# Patient Record
Sex: Male | Born: 1985 | Race: Black or African American | Hispanic: No | Marital: Married | Smoking: Never smoker
Health system: Southern US, Community
[De-identification: ages and names within clinical notes are randomized; demographics above are authoritative.]

---

## 2007-10-27 ENCOUNTER — Encounter: Admission: RE | Admit: 2007-10-27 | Discharge: 2007-10-27 | Payer: Self-pay | Admitting: Occupational Medicine

## 2009-08-02 IMAGING — CR DG CHEST 1V
1 series · 1 of 1 positions shown · non-contrast
Comparison: None

CLINICAL DATA: Physical clearance

CHEST - 1 VIEW

[view not recorded]
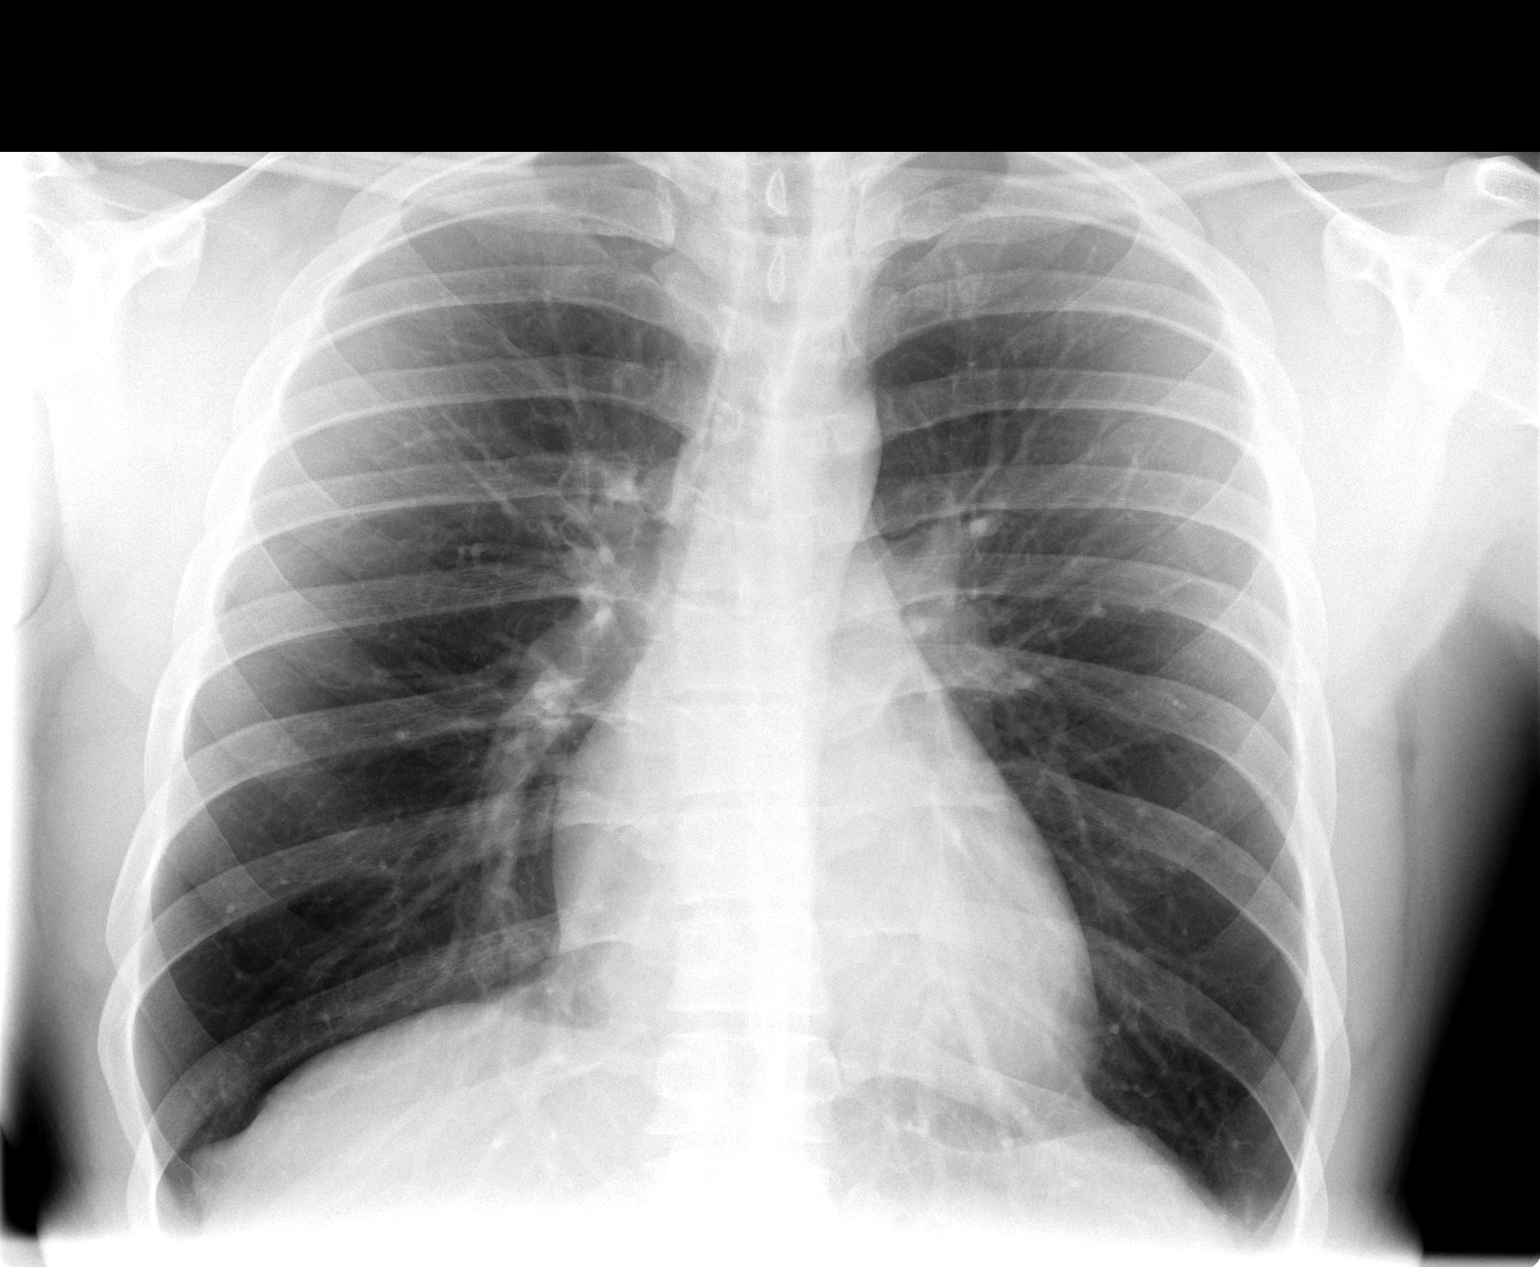

[1 of 1 positions shown; findings below may reference images not displayed]

FINDINGS: Heart size and vascularity are normal and the lungs are
clear.  No bony abnormality of significance.  There is a mild
thoracic scoliosis.
IMPRESSION: No acute disease in the chest.

## 2014-06-14 ENCOUNTER — Encounter (HOSPITAL_COMMUNITY): Payer: Self-pay | Admitting: Family Medicine

## 2014-06-14 ENCOUNTER — Emergency Department (HOSPITAL_COMMUNITY)
Admission: EM | Admit: 2014-06-14 | Discharge: 2014-06-14 | Disposition: A | Discharge status: Home / Self Care | Payer: Worker's Compensation | Attending: Emergency Medicine | Admitting: Emergency Medicine

## 2014-06-14 DIAGNOSIS — M545 Low back pain, unspecified: Secondary | ICD-10-CM

## 2014-06-14 DIAGNOSIS — Y998 Other external cause status: Secondary | ICD-10-CM | POA: Insufficient documentation

## 2014-06-14 DIAGNOSIS — W000XXA Fall on same level due to ice and snow, initial encounter: Secondary | ICD-10-CM | POA: Insufficient documentation

## 2014-06-14 DIAGNOSIS — Y92480 Sidewalk as the place of occurrence of the external cause: Secondary | ICD-10-CM | POA: Insufficient documentation

## 2014-06-14 DIAGNOSIS — M62830 Muscle spasm of back: Secondary | ICD-10-CM

## 2014-06-14 DIAGNOSIS — S3992XA Unspecified injury of lower back, initial encounter: Secondary | ICD-10-CM | POA: Insufficient documentation

## 2014-06-14 DIAGNOSIS — Y9301 Activity, walking, marching and hiking: Secondary | ICD-10-CM | POA: Insufficient documentation

## 2014-06-14 DIAGNOSIS — W009XXA Unspecified fall due to ice and snow, initial encounter: Secondary | ICD-10-CM

## 2014-06-14 MED ORDER — METHOCARBAMOL 500 MG PO TABS: 500.0000 mg | ORAL_TABLET | Freq: Two times a day (BID) | ORAL | Status: DC

## 2014-06-14 NOTE — ED Provider Notes (Signed)
CSN: [id number]     Arrival date & time 06/14/14  2028 History  This chart was scribed for non-physician practitioner, Antony Madura, PA-C working with Toy Cookey, MD by Freida Busman, ED Scribe. This patient was seen in room WTR6/WTR6 and the patient's care was started at 8:42 PM.   Chief Complaint  Patient presents with  . Fall    The history is provided by the patient. No language interpreter was used.    HPI Comments:  Joshua Perkins is a 29 y.o. male who presents to the Emergency Department s/p fall this evening complaining of mild throbbing, nonradiating right lower back pain following the incident . Pt slipped on ice ~1745 while walking on the sidewalk and fell onto his back. He denies head injury and LOC. He also denies numbness/weakness to his BLE, and bowel/bladder incontinence. He denies h/o chronic back pain. No aggravating or alleviating factors noted.   History reviewed. No pertinent past medical history. History reviewed. No pertinent past surgical history. History reviewed. No pertinent family history. History  Substance Use Topics  . Smoking status: Never Smoker   . Smokeless tobacco: Not on file  . Alcohol Use: Yes    Review of Systems  Musculoskeletal: Positive for back pain. Negative for neck pain.  Neurological: Negative for weakness, numbness and headaches.  All other systems reviewed and are negative.   Allergies  Review of patient's allergies indicates no known allergies.  Home Medications   Prior to Admission medications   Medication Sig Start Date End Date Taking? Authorizing Provider  methocarbamol (ROBAXIN) 500 MG tablet Take 1 tablet (500 mg total) by mouth 2 (two) times daily. 06/14/14   Antony Madura, PA-C   BP 124/86 mmHg  Pulse 71  Temp(Src) 98.7 F (37.1 C) (Oral)  Resp 20  Ht 6' (1.829 m)  Wt 240 lb (108.863 kg)  BMI 32.54 kg/m2  SpO2 97%   Physical Exam  Constitutional: He is oriented to person, place, and time. He appears  well-developed and well-nourished. No distress.  Nontoxic/nonseptic appearing  HENT:  Head: Normocephalic and atraumatic.  Eyes: Conjunctivae and EOM are normal. No scleral icterus.  Neck: Normal range of motion.  Cardiovascular: Normal rate, regular rhythm and intact distal pulses.   DP and PT pulses 2+ b/l  Pulmonary/Chest: Effort normal. No respiratory distress.  Respirations even and unlabored.  Musculoskeletal: Normal range of motion.       Thoracic back: Normal.       Lumbar back: He exhibits tenderness and spasm. He exhibits normal range of motion, no bony tenderness, no swelling and no deformity.       Back:  TTP to R lumbar paraspinal muscles with appreciable spasm. No TTP to the T/L midline. No bony deformities, step offs, or crepitus.  Neurological: He is alert and oriented to person, place, and time. He exhibits normal muscle tone. Coordination normal.  Sensation to light touch intact. Patellar and Achilles reflexes 2+ bilaterally. Patient ambulatory with steady gait.  Skin: Skin is warm and dry. No rash noted. He is not diaphoretic. No erythema. No pallor.  Psychiatric: He has a normal mood and affect. His behavior is normal.  Nursing note and vitals reviewed.   ED Course  Procedures   DIAGNOSTIC STUDIES:  Oxygen Saturation is 97% on RA, normal by my interpretation.    COORDINATION OF CARE:  8:47 PM Advised pt to apply ice and heat at home and to take anti-inflammatories. Will discharge with muscle relaxer. Discussed treatment plan  with pt at bedside and pt agreed to plan.  Labs Review Labs Reviewed - No data to display  Imaging Review No results found.   EKG Interpretation None      MDM   Final diagnoses:  Fall due to ice or snow, initial encounter  Spasm of muscle, back  Right-sided low back pain without sciatica    Patient with back pain after a slip and fall on ice. No head trauma or loss of consciousness. Patient neurovascularly intact. No  tenderness to the thoracic or lumbar midline. There is right lumbar paraspinal muscle tenderness with appreciable spasm. No loss of bowel or bladder control. No concern for cauda equina. No indication for further emergent workup or imaging. RICE protocol and pain medicine indicated and discussed with patient. Return precautions discussed and provided. Patient agreeable to plan with no unaddressed concerns.  I personally performed the services described in this documentation, which was scribed in my presence. The recorded information has been reviewed and is accurate.   Filed Vitals:   06/14/14 2038  BP: 124/86  Pulse: 71  Temp: 98.7 F (37.1 C)  TempSrc: Oral  Resp: 20  Height: 6' (1.829 m)  Weight: 240 lb (108.863 kg)  SpO2: 97%      Antony MaduraKelly Milind Raether, PA-C 06/14/14 2104  Toy CookeyMegan Docherty, MD 06/15/14 [phone]

## 2014-06-14 NOTE — ED Notes (Signed)
Patient states he was walking on side walk. Fell from the pavement being icy and slippery. Pt landed on back. Pt didn't take any medications before coming to facility. Denies tingling or numbness and loss of bowel/bladder.

## 2014-06-14 NOTE — Discharge Instructions (Signed)
Alternate ice and heat 3-4 times per day. Take 600mg  ibuprofen every 6 hours or Naproxen (Aleve) twice a day. Take this with Robaxin for muscle spasms. Follow up with your doctor for a recheck of symptoms in 1 week.  Muscle Cramps and Spasms Muscle cramps and spasms occur when a muscle or muscles tighten and you have no control over this tightening (involuntary muscle contraction). They are a common problem and can develop in any muscle. The most common place is in the calf muscles of the leg. Both muscle cramps and muscle spasms are involuntary muscle contractions, but they also have differences:   Muscle cramps are sporadic and painful. They may last a few seconds to a quarter of an hour. Muscle cramps are often more forceful and last longer than muscle spasms.  Muscle spasms may or may not be painful. They may also last just a few seconds or much longer. CAUSES  It is uncommon for cramps or spasms to be due to a serious underlying problem. In many cases, the cause of cramps or spasms is unknown. Some common causes are:   Overexertion.   Overuse from repetitive motions (doing the same thing over and over).   Remaining in a certain position for a long period of time.   Improper preparation, form, or technique while performing a sport or activity.   Dehydration.   Injury.   Side effects of some medicines.   Abnormally low levels of the salts and ions in your blood (electrolytes), especially potassium and calcium. This could happen if you are taking water pills (diuretics) or you are pregnant.  Some underlying medical problems can make it more likely to develop cramps or spasms. These include, but are not limited to:   Diabetes.   Parkinson disease.   Hormone disorders, such as thyroid problems.   Alcohol abuse.   Diseases specific to muscles, joints, and bones.   Blood vessel disease where not enough blood is getting to the muscles.  HOME CARE INSTRUCTIONS    Stay well hydrated. Drink enough water and fluids to keep your urine clear or pale yellow.  It may be helpful to massage, stretch, and relax the affected muscle.  For tight or tense muscles, use a warm towel, heating pad, or hot shower water directed to the affected area.  If you are sore or have pain after a cramp or spasm, applying ice to the affected area may relieve discomfort.  Put ice in a plastic bag.  Place a towel between your skin and the bag.  Leave the ice on for 15-20 minutes, 03-04 times a day.  Medicines used to treat a known cause of cramps or spasms may help reduce their frequency or severity. Only take over-the-counter or prescription medicines as directed by your caregiver. SEEK MEDICAL CARE IF:  Your cramps or spasms get more severe, more frequent, or do not improve over time.  MAKE SURE YOU:   Understand these instructions.  Will watch your condition.  Will get help right away if you are not doing well or get worse. Document Released: 10/06/2001 Document Revised: 08/11/2012 Document Reviewed: 04/02/2012 [facility] Patient Information 2015 [place], [place]. This information is not intended to replace advice given to you by your health care provider. Make sure you discuss any questions you have with your health care provider. Back Pain, Adult Low back pain is very common. About 1 in 5 people have back pain.The cause of low back pain is rarely dangerous. The pain often gets  better over time.About half of people with a sudden onset of back pain feel better in just 2 weeks. About 8 in 10 people feel better by 6 weeks.  CAUSES Some common causes of back pain include:  Strain of the muscles or ligaments supporting the spine.  Wear and tear (degeneration) of the spinal discs.  Arthritis.  Direct injury to the back. DIAGNOSIS Most of the time, the direct cause of low back pain is not known.However, back pain can be treated effectively even when the exact cause  of the pain is unknown.Answering your caregiver's questions about your overall health and symptoms is one of the most accurate ways to make sure the cause of your pain is not dangerous. If your caregiver needs more information, he or she may order lab work or imaging tests (X-rays or MRIs).However, even if imaging tests show changes in your back, this usually does not require surgery. HOME CARE INSTRUCTIONS For many people, back pain returns.Since low back pain is rarely dangerous, it is often a condition that people can learn to [facility] their own.   Remain active. It is stressful on the back to sit or stand in one place. Do not sit, drive, or stand in one place for more than 30 minutes at a time. Take short walks on level surfaces as soon as pain allows.Try to increase the length of time you walk each day.  Do not stay in bed.Resting more than 1 or 2 days can delay your recovery.  Do not avoid exercise or work.Your body is made to move.It is not dangerous to be active, even though your back may hurt.Your back will likely heal faster if you return to being active before your pain is gone.  Pay attention to your body when you bend and lift. Many people have less discomfortwhen lifting if they bend their knees, keep the load close to their bodies,and avoid twisting. Often, the most comfortable positions are those that put less stress on your recovering back.  Find a comfortable position to sleep. Use a firm mattress and lie on your side with your knees slightly bent. If you lie on your back, put a pillow under your knees.  Only take over-the-counter or prescription medicines as directed by your caregiver. Over-the-counter medicines to reduce pain and inflammation are often the most helpful.Your caregiver may prescribe muscle relaxant drugs.These medicines help dull your pain so you can more quickly return to your normal activities and healthy exercise.  Put ice on the injured  area.  Put ice in a plastic bag.  Place a towel between your skin and the bag.  Leave the ice on for 15-20 minutes, 03-04 times a day for the first 2 to 3 days. After that, ice and heat may be alternated to reduce pain and spasms.  Ask your caregiver about trying back exercises and gentle massage. This may be of some benefit.  Avoid feeling anxious or stressed.Stress increases muscle tension and can worsen back pain.It is important to recognize when you are anxious or stressed and learn ways to manage it.Exercise is a great option. SEEK MEDICAL CARE IF:  You have pain that is not relieved with rest or medicine.  You have pain that does not improve in 1 week.  You have new symptoms.  You are generally not feeling well. SEEK IMMEDIATE MEDICAL CARE IF:   You have pain that radiates from your back into your legs.  You develop new bowel or bladder control problems.  You  have unusual weakness or numbness in your arms or legs.  You develop nausea or vomiting.  You develop abdominal pain.  You feel faint. Document Released: 04/16/2005 Document Revised: 10/16/2011 Document Reviewed: 08/18/2013 [facility] Patient Information 2015 [place], [place]. This information is not intended to replace advice given to you by your health care provider. Make sure you discuss any questions you have with your health care provider.

## 2016-10-28 DIAGNOSIS — L739 Follicular disorder, unspecified: Secondary | ICD-10-CM | POA: Diagnosis not present

## 2019-06-19 ENCOUNTER — Ambulatory Visit: Payer: Self-pay | Attending: Internal Medicine

## 2019-06-19 DIAGNOSIS — Z23 Encounter for immunization: Secondary | ICD-10-CM | POA: Insufficient documentation

## 2019-06-19 NOTE — Progress Notes (Signed)
   Covid-19 Vaccination Clinic  Name:  Joshua Perkins    MRN: [id number] DOB: 07/29/1985  06/19/2019  [name] was observed post Covid-19 immunization for 15 minutes without incidence. He was provided with Vaccine Information Sheet and instruction to access the V-Safe system.   [name] was instructed to call 911 with any severe reactions post vaccine: Marland Kitchen Difficulty breathing  . Swelling of your face and throat  . A fast heartbeat  . A bad rash all over your body  . Dizziness and weakness    Immunizations Administered    Name Date Dose VIS Date Route   Pfizer COVID-19 Vaccine 06/19/2019 11:10 AM 0.3 mL 04/10/2019 Intramuscular   Manufacturer: [employer], [employer]   Lot: [id number]   NDC: [address]-4

## 2019-07-14 ENCOUNTER — Ambulatory Visit: Payer: Self-pay | Attending: Internal Medicine

## 2019-07-14 DIAGNOSIS — Z23 Encounter for immunization: Secondary | ICD-10-CM

## 2019-07-14 NOTE — Progress Notes (Signed)
   Covid-19 Vaccination Clinic  Name:  Joshua Perkins    MRN: [id number] DOB: 1985/07/28  07/14/2019  [name] was observed post Covid-19 immunization for 15 minutes without incident. He was provided with Vaccine Information Sheet and instruction to access the V-Safe system.   [name] was instructed to call 911 with any severe reactions post vaccine: Marland Kitchen Difficulty breathing  . Swelling of face and throat  . A fast heartbeat  . A bad rash all over body  . Dizziness and weakness   Immunizations Administered    Name Date Dose VIS Date Route   Pfizer COVID-19 Vaccine 07/14/2019 12:26 PM 0.3 mL 04/10/2019 Intramuscular   Manufacturer: [employer], [employer]   Lot: [id number]   NDC: [address]-1

## 2019-09-16 ENCOUNTER — Other Ambulatory Visit: Payer: Self-pay

## 2019-09-17 ENCOUNTER — Ambulatory Visit: Payer: Self-pay | Admitting: Internal Medicine

## 2019-09-17 DIAGNOSIS — Z0289 Encounter for other administrative examinations: Secondary | ICD-10-CM

## 2019-10-20 ENCOUNTER — Ambulatory Visit: Payer: Self-pay | Admitting: Registered Nurse

## 2019-10-21 ENCOUNTER — Other Ambulatory Visit: Payer: Self-pay

## 2019-10-21 ENCOUNTER — Encounter: Payer: Self-pay | Admitting: Registered Nurse

## 2019-10-21 ENCOUNTER — Ambulatory Visit: Payer: 59 | Admitting: Registered Nurse

## 2019-10-21 VITALS — BP 115/77 | HR 80 | Temp 98.4°F | Ht 72.0 in | Wt 278.0 lb

## 2019-10-21 DIAGNOSIS — Z1329 Encounter for screening for other suspected endocrine disorder: Secondary | ICD-10-CM | POA: Diagnosis not present

## 2019-10-21 DIAGNOSIS — Z23 Encounter for immunization: Secondary | ICD-10-CM

## 2019-10-21 DIAGNOSIS — Z1159 Encounter for screening for other viral diseases: Secondary | ICD-10-CM

## 2019-10-21 DIAGNOSIS — Z13228 Encounter for screening for other metabolic disorders: Secondary | ICD-10-CM

## 2019-10-21 DIAGNOSIS — Z7689 Persons encountering health services in other specified circumstances: Secondary | ICD-10-CM

## 2019-10-21 DIAGNOSIS — Z13220 Encounter for screening for lipoid disorders: Secondary | ICD-10-CM

## 2019-10-21 DIAGNOSIS — Z130 Encounter for screening for diseases of the blood and blood-forming organs and certain disorders involving the immune mechanism: Secondary | ICD-10-CM

## 2019-10-21 DIAGNOSIS — R0683 Snoring: Secondary | ICD-10-CM

## 2019-10-21 NOTE — Patient Instructions (Signed)
° ° ° °  If you have lab work done today you will be contacted with your lab results within the next 2 weeks.  If you have not heard from us then please contact us. The fastest way to get your results is to register for My Chart. ° ° °IF you received an x-ray today, you will receive an invoice from [facility]. Please contact [facility] at [phone] with questions or concerns regarding your invoice.  ° °IF you received labwork today, you will receive an invoice from [facility]. Please contact [facility] at [phone] with questions or concerns regarding your invoice.  ° °Our billing staff will not be able to assist you with questions regarding bills from these companies. ° °You will be contacted with the lab results as soon as they are available. The fastest way to get your results is to activate your My Chart account. Instructions are located on the last page of this paperwork. If you have not heard from us regarding the results in 2 weeks, please contact this office. °  ° ° ° °

## 2019-10-22 LAB — HEMOGLOBIN A1C
Est. average glucose Bld gHb Est-mCnc: 114 mg/dL
Hgb A1c MFr Bld: 5.6 % (ref 4.8–5.6)

## 2019-10-22 LAB — COMPREHENSIVE METABOLIC PANEL
ALT: 43 IU/L (ref 0–44)
AST: 28 IU/L (ref 0–40)
Albumin/Globulin Ratio: 1.6 (ref 1.2–2.2)
Albumin: 4.4 g/dL (ref 4.0–5.0)
Alkaline Phosphatase: 50 IU/L (ref 48–121)
BUN/Creatinine Ratio: 8 — ABNORMAL LOW (ref 9–20)
BUN: 10 mg/dL (ref 6–20)
Bilirubin Total: 0.2 mg/dL (ref 0.0–1.2)
CO2: 22 mmol/L (ref 20–29)
Calcium: 9.3 mg/dL (ref 8.7–10.2)
Chloride: 104 mmol/L (ref 96–106)
Creatinine, Ser: 1.18 mg/dL (ref 0.76–1.27)
GFR calc Af Amer: 93 mL/min/{1.73_m2} (ref >59)
GFR calc non Af Amer: 80 mL/min/{1.73_m2} (ref >59)
Globulin, Total: 2.7 g/dL (ref 1.5–4.5)
Glucose: 94 mg/dL (ref 65–99)
Potassium: 4.3 mmol/L (ref 3.5–5.2)
Sodium: 143 mmol/L (ref 134–144)
Total Protein: 7.1 g/dL (ref 6.0–8.5)

## 2019-10-22 LAB — HIV ANTIBODY (ROUTINE TESTING W REFLEX): HIV Screen 4th Generation wRfx: NONREACTIVE

## 2019-10-22 LAB — CBC WITH DIFFERENTIAL
Basophils Absolute: 0 10*3/uL (ref 0.0–0.2)
Basos: 0 %
EOS (ABSOLUTE): 0.1 10*3/uL (ref 0.0–0.4)
Eos: 2 %
Hematocrit: 42.4 % (ref 37.5–51.0)
Hemoglobin: 14.6 g/dL (ref 13.0–17.7)
Immature Grans (Abs): 0 10*3/uL (ref 0.0–0.1)
Immature Granulocytes: 0 %
Lymphocytes Absolute: 2.3 10*3/uL (ref 0.7–3.1)
Lymphs: 29 %
MCH: 29.9 pg (ref 26.6–33.0)
MCHC: 34.4 g/dL (ref 31.5–35.7)
MCV: 87 fL (ref 79–97)
Monocytes Absolute: 1 10*3/uL — ABNORMAL HIGH (ref 0.1–0.9)
Monocytes: 13 %
Neutrophils Absolute: 4.4 10*3/uL (ref 1.4–7.0)
Neutrophils: 56 %
RBC: 4.89 x10E6/uL (ref 4.14–5.80)
RDW: 13 % (ref 11.6–15.4)
WBC: 7.9 10*3/uL (ref 3.4–10.8)

## 2019-10-22 LAB — LIPID PANEL
Chol/HDL Ratio: 5.5 ratio — ABNORMAL HIGH (ref 0.0–5.0)
Cholesterol, Total: 238 mg/dL — ABNORMAL HIGH (ref 100–199)
HDL: 43 mg/dL (ref >39)
LDL Chol Calc (NIH): 175 mg/dL — ABNORMAL HIGH (ref 0–99)
Triglycerides: 110 mg/dL (ref 0–149)
VLDL Cholesterol Cal: 20 mg/dL (ref 5–40)

## 2019-10-22 LAB — TSH: TSH: 1.61 u[IU]/mL (ref 0.450–4.500)

## 2019-10-22 LAB — HEPATITIS C ANTIBODY: Hep C Virus Ab: <0.1 s/co ratio (ref 0.0–0.9)

## 2019-10-22 LAB — SPECIMEN STATUS REPORT

## 2019-12-02 ENCOUNTER — Telehealth: Payer: Self-pay | Admitting: Registered Nurse

## 2019-12-02 NOTE — Telephone Encounter (Signed)
Please advise on this bc I do not see that a referral has been placed.

## 2019-12-02 NOTE — Telephone Encounter (Signed)
Pt is stating on his last dos he talked about getting a referral for his sleep apnea. I do not see a referral placed, please advise at [phone]

## 2020-01-01 ENCOUNTER — Ambulatory Visit: Payer: Self-pay | Admitting: Internal Medicine

## 2020-01-05 ENCOUNTER — Other Ambulatory Visit: Payer: Self-pay | Admitting: Registered Nurse

## 2020-01-05 DIAGNOSIS — R0681 Apnea, not elsewhere classified: Secondary | ICD-10-CM

## 2020-01-21 ENCOUNTER — Encounter: Payer: Self-pay | Admitting: Registered Nurse

## 2020-01-21 NOTE — Progress Notes (Signed)
New Patient Office Visit  Subjective:  Patient ID: Joshua Perkins, male    DOB: [birth year]  Age: 34 y.o. MRN: [id number]  CC:  Chief Complaint  Patient presents with   Establish Care   snoring while sleeping    loudly     HPI Joshua Perkins presents for visit to est care  Histories reviewed  Patients concern today is snoring. Partner has witnessed apnea, gasping, and coughing in sleep. Pt reports waking with headache, daytime fatigue, family hx of osa  Denies chest pain, shob, doe, visual changes, dependent edema  No other concerns at this time  No past medical history on file.  No past surgical history on file.  No family history on file.  Social History   Socioeconomic History   Marital status: Unknown    Spouse name: Not on file   Number of children: Not on file   Years of education: Not on file   Highest education level: Not on file  Occupational History   Not on file  Tobacco Use   Smoking status: Never Smoker   Smokeless tobacco: Never Used  Substance and Sexual Activity   Alcohol use: Yes   Drug use: No   Sexual activity: Yes  Other Topics Concern   Not on file  Social History Narrative   Not on file   Social Determinants of Health   Financial Resource Strain:    Difficulty of Paying Living Expenses: Not on file  Food Insecurity:    Worried About Running Out of Food in the Last Year: Not on file   Ran Out of Food in the Last Year: Not on file  Transportation Needs:    Lack of Transportation (Medical): Not on file   Lack of Transportation (Non-Medical): Not on file  Physical Activity:    Days of Exercise per Week: Not on file   Minutes of Exercise per Session: Not on file  Stress:    Feeling of Stress : Not on file  Social Connections:    Frequency of Communication with Friends and Family: Not on file   Frequency of Social Gatherings with Friends and Family: Not on file   Attends Religious Services: Not on file    Active Member of Clubs or Organizations: Not on file   Attends Banker Meetings: Not on file   Marital Status: Not on file  Intimate Partner Violence:    Fear of Current or Ex-Partner: Not on file   Emotionally Abused: Not on file   Physically Abused: Not on file   Sexually Abused: Not on file    ROS Review of Systems Per hpi   Objective:   Today's Vitals: BP 115/77    Pulse 80    Temp 98.4 F (36.9 C) (Temporal)    Ht 6' (1.829 m)    Wt 278 lb (126.1 kg)    SpO2 99%    BMI 37.70 kg/m   Physical Exam Vitals and nursing note reviewed.  Constitutional:      General: He is not in acute distress.    Appearance: Normal appearance. He is obese. He is not ill-appearing, toxic-appearing or diaphoretic.  Cardiovascular:     Rate and Rhythm: Normal rate and regular rhythm.     Heart sounds: Normal heart sounds. No murmur heard.  No friction rub. No gallop.   Pulmonary:     Effort: Pulmonary effort is normal. No respiratory distress.     Breath sounds: Normal breath sounds. No stridor. No  wheezing, rhonchi or rales.  Chest:     Chest wall: No tenderness.  Skin:    General: Skin is warm and dry.  Neurological:     General: No focal deficit present.     Mental Status: He is alert and oriented to person, place, and time. Mental status is at baseline.  Psychiatric:        Mood and Affect: Mood normal.        Thought Content: Thought content normal.        Judgment: Judgment normal.     Assessment & Plan:   Problem List Items Addressed This Visit    None    Visit Diagnoses    Screening for endocrine, metabolic and immunity disorder    -  Primary   Relevant Orders   CBC With Differential (Completed)   Comprehensive metabolic panel (Completed)   TSH (Completed)   Hemoglobin A1c (Completed)   HIV antibody (with reflex)   Need for diphtheria-tetanus-pertussis (Tdap) vaccine       Relevant Orders   Tdap vaccine greater than or equal to 7yo IM   Screening  for viral disease       Relevant Orders   Hepatitis C antibody (Completed)   Lipid screening       Relevant Orders   Lipid panel (Completed)      Outpatient Encounter Medications as of 10/21/2019  Medication Sig   [DISCONTINUED] methocarbamol (ROBAXIN) 500 MG tablet Take 1 tablet (500 mg total) by mouth 2 (two) times daily.   No facility-administered encounter medications on file as of 10/21/2019.    Follow-up: No follow-ups on file.   PLAN  Suspect OSA. Will refer to pulm  Routine labs collected, will follow up as warranted  Patient encouraged to call clinic with any questions, comments, or concerns.  Janeece Agee, NP

## 2020-02-01 ENCOUNTER — Institutional Professional Consult (permissible substitution): Payer: 59 | Admitting: Pulmonary Disease

## 2020-02-12 ENCOUNTER — Telehealth: Payer: Self-pay | Admitting: Registered Nurse

## 2020-02-12 NOTE — Telephone Encounter (Signed)
Referral Followup °

## 2020-03-03 ENCOUNTER — Encounter: Payer: Self-pay | Admitting: Internal Medicine

## 2020-03-03 ENCOUNTER — Ambulatory Visit (INDEPENDENT_AMBULATORY_CARE_PROVIDER_SITE_OTHER): Payer: 59 | Admitting: Internal Medicine

## 2020-03-03 ENCOUNTER — Other Ambulatory Visit: Payer: Self-pay

## 2020-03-03 VITALS — BP 122/68 | HR 97 | Temp 97.5°F | Ht 72.0 in | Wt 263.8 lb

## 2020-03-03 DIAGNOSIS — R0683 Snoring: Secondary | ICD-10-CM | POA: Diagnosis not present

## 2020-03-03 DIAGNOSIS — G4720 Circadian rhythm sleep disorder, unspecified type: Secondary | ICD-10-CM

## 2020-03-03 DIAGNOSIS — Z23 Encounter for immunization: Secondary | ICD-10-CM | POA: Diagnosis not present

## 2020-03-03 DIAGNOSIS — G4733 Obstructive sleep apnea (adult) (pediatric): Secondary | ICD-10-CM | POA: Diagnosis not present

## 2020-03-03 NOTE — Patient Instructions (Signed)
Order- schedule home sleep test dx OSA  Please call us about 2 weeks after your sleep test to see if results and recommendations are ready. If appropriate, we may be able to start treatment before we see you next.

## 2020-03-03 NOTE — Progress Notes (Signed)
03/03/20- 34 yo M never smoker for sleep evaluation courtesy of Janeece Agee, NP with concern of witnessed apnea. Works Doctor, general practice, night shift,  Medical problem list includes- none reported Epworth score- 8 Body weight today- 263 lbs Covid vax- 2 Phizer Flu vax- standard -----pt has nver had sleep consult and pt states snoring and stop breathing in sleep Wife complains of his snoring and witnessed apnea.  He tries to maintain appropriate daytime sleep environment- discussed. Occasionally tired at work.  No sleep meds. Occasional energy drink. Parents were loud snorers. Denies ENT surgery, heart, lung or neurologic problems. No parasomnias.   Prior to Admission medications   Not on File   No past medical history on file. No past surgical history on file. No family history on file. Social History   Socioeconomic History  . Marital status: Unknown    Spouse name: Not on file  . Number of children: Not on file  . Years of education: Not on file  . Highest education level: Not on file  Occupational History  . Not on file  Tobacco Use  . Smoking status: Never Smoker  . Smokeless tobacco: Never Used  Substance and Sexual Activity  . Alcohol use: Yes  . Drug use: No  . Sexual activity: Yes  Other Topics Concern  . Not on file  Social History Narrative  . Not on file   Social Determinants of Health   Financial Resource Strain:   . Difficulty of Paying Living Expenses: Not on file  Food Insecurity:   . Worried About Programme researcher, broadcasting/film/video in the Last Year: Not on file  . Ran Out of Food in the Last Year: Not on file  Transportation Needs:   . Lack of Transportation (Medical): Not on file  . Lack of Transportation (Non-Medical): Not on file  Physical Activity:   . Days of Exercise per Week: Not on file  . Minutes of Exercise per Session: Not on file  Stress:   . Feeling of Stress : Not on file  Social Connections:   . Frequency of Communication with Friends  and Family: Not on file  . Frequency of Social Gatherings with Friends and Family: Not on file  . Attends Religious Services: Not on file  . Active Member of Clubs or Organizations: Not on file  . Attends Banker Meetings: Not on file  . Marital Status: Not on file  Intimate Partner Violence:   . Fear of Current or Ex-Partner: Not on file  . Emotionally Abused: Not on file  . Physically Abused: Not on file  . Sexually Abused: Not on file   ROS-see HPI  + = positive Constitutional:    weight loss, night sweats, fevers, chills, fatigue, lassitude. HEENT:    headaches, difficulty swallowing, tooth/dental problems, sore throat,       sneezing, itching, ear ache, nasal congestion, post nasal drip, snoring CV:    chest pain, orthopnea, PND, swelling in lower extremities, anasarca,                                  dizziness, palpitations Resp:   shortness of breath with exertion or at rest.                productive cough,   non-productive cough, coughing up of blood.              change in color of  mucus.  wheezing.   Skin:    rash or lesions. GI:  No-   heartburn, indigestion, abdominal pain, nausea, vomiting, diarrhea,                 change in bowel habits, loss of appetite GU: dysuria, change in color of urine, no urgency or frequency.   flank pain. MS:   joint pain, stiffness, decreased range of motion, back pain. Neuro-     nothing unusual Psych:  change in mood or affect.  depression or anxiety.   memory loss.  OBJ- Physical Exam General- Alert, Oriented, Affect-appropriate, Distress- none acute, +overweight/ muscular Skin- rash-none, lesions- none, excoriation- none Lymphadenopathy- none Head- atraumatic            Eyes- Gross vision intact, PERRLA, conjunctivae and secretions clear            Ears- Hearing, canals-normal            Nose- Clear, no-Septal dev, mucus, polyps, erosion, perforation             Throat- Mallampati II -III, mucosa clear , drainage- none,  tonsils- atrophic, + teeth Neck- flexible , trachea midline, no stridor , thyroid nl, carotid no bruit Chest - symmetrical excursion , unlabored           Heart/CV- RRR , no murmur , no gallop  , no rub, nl s1 s2                           - JVD- none , edema- none, stasis changes- none, varices- none           Lung- clear to P&A, wheeze- none, cough- none , dullness-none, rub- none           Chest wall-  Abd-  Br/ Gen/ Rectal- Not done, not indicated Extrem- cyanosis- none, clubbing, none, atrophy- none, strength- nl Neuro- grossly intact to observation

## 2020-03-18 ENCOUNTER — Encounter: Payer: Self-pay | Admitting: Registered Nurse

## 2020-03-18 ENCOUNTER — Other Ambulatory Visit: Payer: Self-pay

## 2020-03-18 ENCOUNTER — Ambulatory Visit: Payer: 59 | Admitting: Registered Nurse

## 2020-03-18 VITALS — BP 126/78 | HR 75 | Temp 98.0°F | Resp 18 | Ht 72.0 in | Wt 262.0 lb

## 2020-03-18 DIAGNOSIS — E782 Mixed hyperlipidemia: Secondary | ICD-10-CM

## 2020-03-18 NOTE — Progress Notes (Signed)
Established Patient Office Visit  Subjective:  Patient ID: Joshua Perkins, male    DOB: [birth year]  Age: 34 y.o. MRN: [id number]  CC:  Chief Complaint  Patient presents with  . Follow-up    Patient states he is here to follow up on his labs he had done in june. And also to check lipids.    HPI Joshua Perkins presents for lab review  Noted in June 2021 he had elevated lipids: Lipid Panel     Component Value Date/Time   CHOL 238 (H) 10/21/2019 1638   TRIG 110 10/21/2019 1638   HDL 43 10/21/2019 1638   CHOLHDL 5.5 (H) 10/21/2019 1638   LDLCALC 175 (H) 10/21/2019 1638   LABVLDL 20 10/21/2019 1638   Patient has some questions and concerns regarding these results and what he can do to best address these.  In addition, patient has recently been seen by Sleep med to get scheduled for home sleep study for potential OSA. Epworth score noted to be at 8.  No past medical history on file.  No past surgical history on file.  No family history on file.  Social History   Socioeconomic History  . Marital status: Unknown    Spouse name: Not on file  . Number of children: Not on file  . Years of education: Not on file  . Highest education level: Not on file  Occupational History  . Not on file  Tobacco Use  . Smoking status: Never Smoker  . Smokeless tobacco: Never Used  Substance and Sexual Activity  . Alcohol use: Yes  . Drug use: No  . Sexual activity: Yes  Other Topics Concern  . Not on file  Social History Narrative  . Not on file   Social Determinants of Health   Financial Resource Strain:   . Difficulty of Paying Living Expenses: Not on file  Food Insecurity:   . Worried About Programme researcher, broadcasting/film/video in the Last Year: Not on file  . Ran Out of Food in the Last Year: Not on file  Transportation Needs:   . Lack of Transportation (Medical): Not on file  . Lack of Transportation (Non-Medical): Not on file  Physical Activity:   . Days of Exercise per Week: Not on file    . Minutes of Exercise per Session: Not on file  Stress:   . Feeling of Stress : Not on file  Social Connections:   . Frequency of Communication with Friends and Family: Not on file  . Frequency of Social Gatherings with Friends and Family: Not on file  . Attends Religious Services: Not on file  . Active Member of Clubs or Organizations: Not on file  . Attends Banker Meetings: Not on file  . Marital Status: Not on file  Intimate Partner Violence:   . Fear of Current or Ex-Partner: Not on file  . Emotionally Abused: Not on file  . Physically Abused: Not on file  . Sexually Abused: Not on file    No outpatient medications prior to visit.   No facility-administered medications prior to visit.    No Known Allergies  ROS Review of Systems  Constitutional: Negative.   HENT: Negative.   Eyes: Negative.   Respiratory: Negative.   Cardiovascular: Negative.   Gastrointestinal: Negative.   Genitourinary: Negative.   Musculoskeletal: Negative.   Skin: Negative.   Neurological: Negative.   Psychiatric/Behavioral: Negative.       Objective:    Physical Exam Constitutional:  General: He is not in acute distress.    Appearance: Normal appearance. He is normal weight. He is not ill-appearing, toxic-appearing or diaphoretic.  Cardiovascular:     Rate and Rhythm: Normal rate and regular rhythm.     Heart sounds: Normal heart sounds. No murmur heard.  No friction rub. No gallop.   Pulmonary:     Effort: Pulmonary effort is normal. No respiratory distress.     Breath sounds: Normal breath sounds. No stridor. No wheezing, rhonchi or rales.  Chest:     Chest wall: No tenderness.  Neurological:     General: No focal deficit present.     Mental Status: He is alert and oriented to person, place, and time. Mental status is at baseline.  Psychiatric:        Mood and Affect: Mood normal.        Behavior: Behavior normal.        Thought Content: Thought content  normal.        Judgment: Judgment normal.     BP (!) 143/83   Pulse 75   Temp 98 F (36.7 C) (Temporal)   Resp 18   Ht 6' (1.829 m)   Wt 262 lb (118.8 kg)   SpO2 96%   BMI 35.53 kg/m  Wt Readings from Last 3 Encounters:  03/18/20 262 lb (118.8 kg)  03/03/20 263 lb 12.8 oz (119.7 kg)  10/21/19 278 lb (126.1 kg)     There are no preventive care reminders to display for this patient.  There are no preventive care reminders to display for this patient.  Lab Results  Component Value Date   TSH 1.610 10/21/2019   Lab Results  Component Value Date   WBC 7.9 10/21/2019   HGB 14.6 10/21/2019   HCT 42.4 10/21/2019   MCV 87 10/21/2019   Lab Results  Component Value Date   NA 143 10/21/2019   K 4.3 10/21/2019   CO2 22 10/21/2019   GLUCOSE 94 10/21/2019   BUN 10 10/21/2019   CREATININE 1.18 10/21/2019   BILITOT 0.2 10/21/2019   ALKPHOS 50 10/21/2019   AST 28 10/21/2019   ALT 43 10/21/2019   PROT 7.1 10/21/2019   ALBUMIN 4.4 10/21/2019   CALCIUM 9.3 10/21/2019   Lab Results  Component Value Date   CHOL 238 (H) 10/21/2019   Lab Results  Component Value Date   HDL 43 10/21/2019   Lab Results  Component Value Date   LDLCALC 175 (H) 10/21/2019   Lab Results  Component Value Date   TRIG 110 10/21/2019   Lab Results  Component Value Date   CHOLHDL 5.5 (H) 10/21/2019   Lab Results  Component Value Date   HGBA1C 5.6 10/21/2019      Assessment & Plan:   Problem List Items Addressed This Visit    None    Visit Diagnoses    Mixed hyperlipidemia    -  Primary   Relevant Orders   Lipid Panel      No orders of the defined types were placed in this encounter.   Follow-up: No follow-ups on file.   PLAN  Recheck lipids. May start statin therapy if warranted  Had extensive discussion of lifestyle modifications for control of hyperlipidemia  Return to clinic in 6 mo for CPE and labs.  Patient encouraged to call clinic with any questions,  comments, or concerns.  Janeece Agee, NP

## 2020-03-18 NOTE — Patient Instructions (Addendum)
If you have lab work done today you will be contacted with your lab results within the next 2 weeks.  If you have not heard from [place] then please contact us. The fastest way to get your results is to register for My Chart.   IF you received an x-ray today, you will receive an invoice from [facility]. Please contact [facility] at [phone] with questions or concerns regarding your invoice.   IF you received labwork today, you will receive an invoice from [facility]. Please contact [facility] at [phone] with questions or concerns regarding your invoice.   Our billing staff will not be able to assist you with questions regarding bills from these companies.  You will be contacted with the lab results as soon as they are available. The fastest way to get your results is to activate your My Chart account. Instructions are located on the last page of this paperwork. If you have not heard from [place] regarding the results in 2 weeks, please contact this office.      Dyslipidemia Dyslipidemia is an imbalance of waxy, fat-like substances (lipids) in the blood. The body needs lipids in small amounts. Dyslipidemia often involves a high level of cholesterol or triglycerides, which are types of lipids. Common forms of dyslipidemia include:  High levels of LDL cholesterol. LDL is the type of cholesterol that causes fatty deposits (plaques) to build up in the blood vessels that carry blood away from your heart (arteries).  Low levels of HDL cholesterol. HDL cholesterol is the type of cholesterol that protects against heart disease. High levels of HDL remove the LDL buildup from arteries.  High levels of triglycerides. Triglycerides are a fatty substance in the blood that is linked to a buildup of plaques in the arteries. What are the causes? Primary dyslipidemia is caused by changes (mutations) in genes that are passed down through families (inherited). These mutations cause several  types of dyslipidemia. Secondary dyslipidemia is caused by lifestyle choices and diseases that lead to dyslipidemia, such as:  Eating a diet that is high in animal fat.  Not getting enough exercise.  Having diabetes, kidney disease, liver disease, or thyroid disease.  Drinking large amounts of alcohol.  Using certain medicines. What increases the risk? You are more likely to develop this condition if you are an older man or if you are a woman who has gone through menopause. Other risk factors include:  Having a family history of dyslipidemia.  Taking certain medicines, including birth control pills, steroids, some diuretics, and beta-blockers.  Smoking cigarettes.  Eating a high-fat diet.  Having certain medical conditions such as diabetes, polycystic ovary syndrome (PCOS), kidney disease, liver disease, or hypothyroidism.  Not exercising regularly.  Being overweight or obese with too much belly fat. What are the signs or symptoms? In most cases, dyslipidemia does not usually cause any symptoms. In severe cases, very high lipid levels can cause:  Fatty bumps under the skin (xanthomas).  White or gray ring around the black center (pupil) of the eye. Very high triglyceride levels can cause inflammation of the pancreas (pancreatitis). How is this diagnosed? Your health care provider may diagnose dyslipidemia based on a routine blood test (fasting blood test). Because most people do not have symptoms of the condition, this blood testing (lipid profile) is done on adults age 72 and older and is repeated every 5 years. This test checks:  Total cholesterol. This measures the total amount of cholesterol in your blood, including  LDL cholesterol, HDL cholesterol, and triglycerides. A healthy number is below 200.  LDL cholesterol. The target number for LDL cholesterol is different for each person, depending on individual risk factors. Ask your health care provider what your LDL  cholesterol should be.  HDL cholesterol. An HDL level of 60 or higher is best because it helps to protect against heart disease. A number below 40 for men or below 107 for women increases the risk for heart disease.  Triglycerides. A healthy triglyceride number is below 150. If your lipid profile is abnormal, your health care provider may do other blood tests. How is this treated? Treatment depends on the type of dyslipidemia that you have and your other risk factors for heart disease and stroke. Your health care provider will have a target range for your lipid levels based on this information. For many people, this condition may be treated by lifestyle changes, such as diet and exercise. Your health care provider may recommend that you:  Get regular exercise.  Make changes to your diet.  Quit smoking if you smoke. If diet changes and exercise do not help you reach your goals, your health care provider may also prescribe medicine to lower lipids. The most commonly prescribed type of medicine lowers your LDL cholesterol (statin drug). If you have a high triglyceride level, your provider may prescribe another type of drug (fibrate) or an omega-3 fish oil supplement, or both. Follow these instructions at home:  Eating and drinking  Follow instructions from your health care provider or dietitian about eating or drinking restrictions.  Eat a healthy diet as told by your health care provider. This can help you reach and maintain a healthy weight, lower your LDL cholesterol, and raise your HDL cholesterol. This may include: ? Limiting your calories, if you are overweight. ? Eating more fruits, vegetables, whole grains, fish, and lean meats. ? Limiting saturated fat, trans fat, and cholesterol.  If you drink alcohol: ? Limit how much you use. ? Be aware of how much alcohol is in your drink. In the [place] one drink equals one 12 oz bottle of beer (355 mL), one 5 oz glass of wine (148 mL), or one 1  oz glass of hard liquor (44 mL).  Do not drink alcohol if: ? Your health care provider tells you not to drink. ? You are pregnant, may be pregnant, or are planning to become pregnant. Activity  Get regular exercise. Start an exercise and strength training program as told by your health care provider. Ask your health care provider what activities are safe for you. Your health care provider may recommend: ? 30 minutes of aerobic activity 4-6 days a week. Brisk walking is an example of aerobic activity. ? Strength training 2 days a week. General instructions  Do not use any products that contain nicotine or tobacco, such as cigarettes, e-cigarettes, and chewing tobacco. If you need help quitting, ask your health care provider.  Take over-the-counter and prescription medicines only as told by your health care provider. This includes supplements.  Keep all follow-up visits as told by your health care provider. Contact a health care provider if:  You are: ? Having trouble sticking to your exercise or diet plan. ? Struggling to quit smoking or control your use of alcohol. Summary  Dyslipidemia often involves a high level of cholesterol or triglycerides, which are types of lipids.  Treatment depends on the type of dyslipidemia that you have and your other risk factors for heart disease  and stroke.  For many people, treatment starts with lifestyle changes, such as diet and exercise.  Your health care provider may prescribe medicine to lower lipids. This information is not intended to replace advice given to you by your health care provider. Make sure you discuss any questions you have with your health care provider. Document Revised: 12/09/2017 Document Reviewed: 11/15/2017 Elsevier Patient Education  2020 [employer]  Preventing High Cholesterol Cholesterol is a white, waxy substance similar to fat that the human body needs to help build cells. The liver makes all the cholesterol that  a person's body needs. Having high cholesterol (hypercholesterolemia) increases a person's risk for heart disease and stroke. Extra (excess) cholesterol comes from the food the person eats. High cholesterol can often be prevented with diet and lifestyle changes. If you already have high cholesterol, you can control it with diet and lifestyle changes and with medicine. How can high cholesterol affect me? If you have high cholesterol, deposits (plaques) may build up on the walls of your arteries. The arteries are the blood vessels that carry blood away from your heart. Plaques make the arteries narrower and stiffer. This can limit or block blood flow and cause blood clots to form. Blood clots:  Are tiny balls of cells that form in your blood.  Can move to the heart or brain, causing a heart attack or stroke. Plaques in arteries greatly increase your risk for heart attack and stroke.Making diet and lifestyle changes can reduce your risk for these conditions that may threaten your life. What can increase my risk? This condition is more likely to develop in people who:  Eat foods that are high in saturated fat or cholesterol. Saturated fat is mostly found in: ? Foods that contain animal fat, such as red meat and some dairy products. ? Certain fatty foods made from plants, such as tropical oils.  Are overweight.  Are not getting enough exercise.  Have a family history of high cholesterol. What actions can I take to prevent this? Nutrition   Eat less saturated fat.  Avoid trans fats (partially hydrogenated oils). These are often found in margarine and in some baked goods, fried foods, and snacks bought in packages.  Avoid precooked or cured meat, such as sausages or meat loaves.  Avoid foods and drinks that have added sugars.  Eat more fruits, vegetables, and whole grains.  Choose healthy sources of protein, such as fish, poultry, lean cuts of red meat, beans, peas, lentils, and  nuts.  Choose healthy sources of fat, such as: ? Nuts. ? Vegetable oils, especially olive oil. ? Fish that have healthy fats (omega-3 fatty acids), such as mackerel or salmon. The items listed above may not be a complete list of recommended foods and beverages. Contact a dietitian for more information. Lifestyle  Lose weight if you are overweight. Losing 5-10 lb (2.3-4.5 kg) can help prevent or control high cholesterol. It can also lower your risk for diabetes and high blood pressure. Ask your health care provider to help you with a diet and exercise plan to lose weight safely.  Do not use any products that contain nicotine or tobacco, such as cigarettes, e-cigarettes, and chewing tobacco. If you need help quitting, ask your health care provider.  Limit your alcohol intake. ? Do not drink alcohol if:  Your health care provider tells you not to drink.  You are pregnant, may be pregnant, or are planning to become pregnant. ? If you drink alcohol:  Limit how  much you use to:  0-1 drink a day for women.  0-2 drinks a day for men.  Be aware of how much alcohol is in your drink. In the [place] one drink equals one 12 oz bottle of beer (355 mL), one 5 oz glass of wine (148 mL), or one 1 oz glass of hard liquor (44 mL). Activity   Get enough exercise. Each week, do at least 150 minutes of exercise that takes a medium level of effort (moderate-intensity exercise). ? This is exercise that:  Makes your heart beat faster and makes you breathe harder than usual.  Allows you to still be able to talk. ? You could exercise in short sessions several times a day or longer sessions a few times a week. For example, on 5 days each week, you could walk fast or ride your bike 3 times a day for 10 minutes each time.  Do exercises as told by your health care provider. Medicines  In addition to diet and lifestyle changes, your health care provider may recommend medicines to help lower cholesterol.  This may be a medicine to lower the amount of cholesterol your liver makes. You may need medicine if: ? Diet and lifestyle changes do not lower your cholesterol enough. ? You have high cholesterol and other risk factors for heart disease or stroke.  Take over-the-counter and prescription medicines only as told by your health care provider. General information  Manage your risk factors for high cholesterol. Talk with your health care provider about all your risk factors and how to lower your risk.  Manage other conditions that you have, such as diabetes or high blood pressure (hypertension).  Have blood tests to check your cholesterol levels at regular points in time as told by your health care provider.  Keep all follow-up visits as told by your health care provider. This is important. Where to find more information  American Heart Association: www.heart.org  National Heart, Lung, and Blood Institute: [employer].is Summary  High cholesterol increases your risk for heart disease and stroke. By keeping your cholesterol level low, you can reduce your risk for these conditions.  High cholesterol can often be prevented with diet and lifestyle changes.  Work with your health care provider to manage your risk factors, and have your blood tested regularly. This information is not intended to replace advice given to you by your health care provider. Make sure you discuss any questions you have with your health care provider. Document Revised: 08/08/2018 Document Reviewed: 12/24/2015 Elsevier Patient Education  2020 [employer].

## 2020-03-19 ENCOUNTER — Encounter: Payer: Self-pay | Admitting: Internal Medicine

## 2020-03-19 DIAGNOSIS — G4720 Circadian rhythm sleep disorder, unspecified type: Secondary | ICD-10-CM | POA: Insufficient documentation

## 2020-03-19 DIAGNOSIS — R0683 Snoring: Secondary | ICD-10-CM | POA: Insufficient documentation

## 2020-03-19 LAB — LIPID PANEL
Chol/HDL Ratio: 5.3 ratio — ABNORMAL HIGH (ref 0.0–5.0)
Cholesterol, Total: 248 mg/dL — ABNORMAL HIGH (ref 100–199)
HDL: 47 mg/dL (ref >39)
LDL Chol Calc (NIH): 179 mg/dL — ABNORMAL HIGH (ref 0–99)
Triglycerides: 121 mg/dL (ref 0–149)
VLDL Cholesterol Cal: 22 mg/dL (ref 5–40)

## 2020-03-19 NOTE — Assessment & Plan Note (Signed)
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

## 2020-03-19 NOTE — Assessment & Plan Note (Signed)
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  Plan- sleep study, then possibly CPAP

## 2020-04-01 ENCOUNTER — Ambulatory Visit: Payer: 59

## 2020-04-01 ENCOUNTER — Other Ambulatory Visit: Payer: Self-pay

## 2020-04-01 DIAGNOSIS — G4733 Obstructive sleep apnea (adult) (pediatric): Secondary | ICD-10-CM

## 2020-04-06 DIAGNOSIS — G4733 Obstructive sleep apnea (adult) (pediatric): Secondary | ICD-10-CM

## 2020-05-03 ENCOUNTER — Telehealth: Payer: Self-pay | Admitting: Internal Medicine

## 2020-05-03 NOTE — Telephone Encounter (Signed)
VS please advise on home sleep test.  Thanks!

## 2020-05-04 NOTE — Telephone Encounter (Signed)
lmtcb for pt. Will order after speaking to pt. Pt needs to postpone 06/04/19 OV with Dr. Maple Hudson.

## 2020-05-04 NOTE — Telephone Encounter (Signed)
Sleep study showed obstructive sleep apnea averaging about 14 apneas/ hour, with drops in blood oxygen levels.  I do recommend order new DME, new CPAP auto 5-20, mask of choice, humidifier, supplies, AirView/ card  Please make sure he has a return ov with me in 31-90 days, per insurance regs.

## 2020-05-04 NOTE — Telephone Encounter (Signed)
I believe this is a patient of Dr. Maple Hudson.

## 2020-05-05 NOTE — Telephone Encounter (Signed)
Lmtcb for pt.  

## 2020-05-10 ENCOUNTER — Telehealth: Payer: Self-pay | Admitting: Internal Medicine

## 2020-05-10 ENCOUNTER — Encounter: Payer: Self-pay | Admitting: Emergency Medicine

## 2020-05-10 DIAGNOSIS — G4733 Obstructive sleep apnea (adult) (pediatric): Secondary | ICD-10-CM

## 2020-05-10 NOTE — Telephone Encounter (Signed)
CPAP settings ordered in my jan 5 comment in this chain.

## 2020-05-10 NOTE — Telephone Encounter (Signed)
Called and spoke with patient to let him know of sleep study results. Patient expressed understanding and would like to proceed with CPAP set up. Advised patient that there was a recall a few months ago on phillips CPAP machine and that the DME companies are trying to get those patients taken care of so there is a bit of a delay on getting patients CPAP machines. He expressed understanding.   Dr. Maple Hudson please advise on CPAP order for patient   Joshua Budge, MD to [facility] Triage Pool     9:15 AM Note Sleep study showed obstructive sleep apnea averaging about 14 apneas/ hour, with drops in blood oxygen levels.  I do recommend order new DME, new CPAP auto 5-20, mask of choice, humidifier, supplies, AirView/ card  Please make sure he has a return ov with me in 31-90 days, per insurance regs.

## 2020-05-10 NOTE — Telephone Encounter (Signed)
Due to several unsuccessful attempts to reach pt, message will be closed per triage protocol. Letter will be sent to pt to return our call for the results.    Triage, please print and mail letter (letter already created). Encounter can be closed once complete.

## 2020-05-10 NOTE — Telephone Encounter (Signed)
Letter printed and placed in outgoing mail for the pt.

## 2020-05-11 NOTE — Telephone Encounter (Signed)
Order for CPAP has been placed. Nothing further needed at this time.

## 2020-06-02 NOTE — Progress Notes (Deleted)
03/03/20- 35 yo M never smoker for sleep evaluation courtesy of Janeece Agee, NP with concern of witnessed apnea. Works Doctor, general practice, night shift,  Medical problem list includes- none reported Epworth score- 8 Body weight today- 263 lbs Covid vax- 2 Phizer Flu vax- standard -----pt has nver had sleep consult and pt states snoring and stop breathing in sleep Wife complains of his snoring and witnessed apnea.  He tries to maintain appropriate daytime sleep environment- discussed. Occasionally tired at work.  No sleep meds. Occasional energy drink. Parents were loud snorers. Denies ENT surgery, heart, lung or neurologic problems. No parasomnias.   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 HEENT:    headaches, difficulty swallowing, tooth/dental problems, sore throat,       sneezing, itching, ear ache, nasal congestion, post nasal drip, snoring CV:    chest pain, orthopnea, PND, swelling in lower extremities, anasarca,                                   dizziness, palpitations Resp:   shortness of breath with exertion or at rest.                productive cough,   non-productive cough, coughing up of blood.              change in color of mucus.  wheezing.   Skin:    rash or lesions. GI:  No-   heartburn, indigestion, abdominal pain, nausea, vomiting, diarrhea,                 change in bowel habits, loss of appetite GU: dysuria, change in color of urine, no urgency or frequency.   flank pain. MS:   joint pain, stiffness, decreased range of motion, back pain. Neuro-     nothing unusual Psych:  change in mood or affect.  depression or anxiety.   memory  loss.  OBJ- Physical Exam General- Alert, Oriented, Affect-appropriate, Distress- none acute, +overweight/ muscular Skin- rash-none, lesions- none, excoriation- none Lymphadenopathy- none Head- atraumatic            Eyes- Gross vision intact, PERRLA, conjunctivae and secretions clear            Ears- Hearing, canals-normal            Nose- Clear, no-Septal dev, mucus, polyps, erosion, perforation             Throat- Mallampati II -III, mucosa clear , drainage- none, tonsils- atrophic, + teeth Neck- flexible , trachea midline, no stridor , thyroid nl, carotid no bruit Chest - symmetrical excursion , unlabored           Heart/CV- RRR , no murmur , no gallop  , no rub, nl s1 s2                           - JVD- none , edema- none, stasis changes- none, varices- none           Lung- clear to P&A, wheeze- none, cough- none , dullness-none, rub- none  Chest wall-  Abd-  Br/ Gen/ Rectal- Not done, not indicated Extrem- cyanosis- none, clubbing, none, atrophy- none, strength- nl Neuro- grossly intact to observation

## 2020-06-03 ENCOUNTER — Ambulatory Visit: Payer: 59 | Admitting: Internal Medicine

## 2020-11-18 ENCOUNTER — Other Ambulatory Visit: Payer: Self-pay | Admitting: Nurse Practitioner

## 2020-11-18 ENCOUNTER — Ambulatory Visit
Admission: RE | Admit: 2020-11-18 | Discharge: 2020-11-18 | Disposition: A | Discharge status: Home / Self Care | Payer: No Typology Code available for payment source | Source: Ambulatory Visit | Attending: Nurse Practitioner | Admitting: Nurse Practitioner

## 2020-11-18 DIAGNOSIS — Z021 Encounter for pre-employment examination: Secondary | ICD-10-CM

## 2021-11-29 ENCOUNTER — Telehealth (INDEPENDENT_AMBULATORY_CARE_PROVIDER_SITE_OTHER): Payer: 59 | Admitting: Registered Nurse

## 2021-11-29 ENCOUNTER — Encounter: Payer: Self-pay | Admitting: Registered Nurse

## 2021-11-29 ENCOUNTER — Ambulatory Visit: Payer: 59 | Admitting: Registered Nurse

## 2021-11-29 DIAGNOSIS — E782 Mixed hyperlipidemia: Secondary | ICD-10-CM

## 2021-11-29 DIAGNOSIS — R41840 Attention and concentration deficit: Secondary | ICD-10-CM

## 2021-11-29 NOTE — Patient Instructions (Addendum)
Mr. Goettl -   Randie Heinz to speak with you.  Call with any concerns.  Labs can be done: [address] [place] - call in advance [address] [place] - Walk Ins ok  I recommend these providers:  Jarold Motto, PA Jacquiline Doe, MD Edwina Barth, MD Letta Moynahan Early, NP Jiles Prows, DNP Glenetta Hew, MD   My recommendation for assessment for attention deficit disorders is [facility]. While they treat ADD and ADHD, they operate under their primary care licenses, so no referral is required. Their information is as below:  [facility] [phone] [place], [place] [address]  Phone: [phone] Email: [email or web address]  If they are able to make a diagnosis and establish an effective medication routine, I am happy to provide maintenance and refills of that medication.   If you'd prefer a referral with a psychiatry group, please let me know, I'd be happy to place this referral.    Thank you for letting me take part in your care,  Rich   If you have lab work done today you will be contacted with your lab results within the next 2 weeks.  If you have not heard from [place] then please contact us. The fastest way to get your results is to register for My Chart.   IF you received an x-ray today, you will receive an invoice from [facility]. Please contact [facility] at [phone] with questions or concerns regarding your invoice.   IF you received labwork today, you will receive an invoice from [place]. Please contact [facility] at [phone] with questions or concerns regarding your invoice.   Our billing staff will not be able to assist you with questions regarding bills from these companies.  You will be contacted with the lab results as soon as they are available. The fastest way to get your results is to activate your My Chart account. Instructions are located on the last page of this  paperwork. If you have not heard from [place] regarding the results in 2 weeks, please contact this office.

## 2021-11-29 NOTE — Progress Notes (Signed)
Telemedicine Encounter- SOAP NOTE Established Patient  This telephone encounter was conducted with the patient's (or proxy's) verbal consent via audio telecommunications: yes/no: Yes Patient was instructed to have this encounter in a suitably private space; and to only have persons present to whom they give permission to participate. In addition, patient identity was confirmed by use of name plus two identifiers (DOB and address).  I discussed the limitations, risks, security and privacy concerns of performing an evaluation and management service by telephone and the availability of in person appointments. I also discussed with the patient that there may be a patient responsible charge related to this service. The patient expressed understanding and agreed to proceed.  I spent a total of 15 minutes talking with the patient or their proxy.  Patient at home Provider in office  Participants: Joshua Sportsman, NP and Joshua Perkins  Chief Complaint  Patient presents with   Follow-up    Patient states he wants to discuss cholesterol and ADHD medication    Subjective   Joshua Perkins is a 36 y.o. established patient. Telephone visit today for lipid, concentration deficit  HPI Concentration deficit Ongoing for some time. Noticed by his wife, who encouraged him to get worked up for this. Trouble focusing, completing tasks. Some anxiety associated.  HLD Ongoing. Not medicated. 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 No pertinent past medical history.  No current outpatient medications on file.   No current facility-administered  medications for this visit.    No Known Allergies  Social History   Socioeconomic History   Marital status: Unknown    Spouse name: Not on file   Number of children: Not on file   Years of education: Not on file   Highest education level: Not on file  Occupational History   Not on file  Tobacco Use   Smoking status: Never   Smokeless tobacco: Never  Substance and Sexual Activity   Alcohol use: Yes   Drug use: No   Sexual activity: Yes  Other Topics Concern   Not on file  Social History Narrative   Not on file   Social Determinants of Health   Financial Resource Strain: Not on file  Food Insecurity: Not on file  Transportation Needs: Not on file  Physical Activity: Not on file  Stress: Not on file  Social Connections: Not on file  Intimate Partner Violence: Not on file    ROS Per hpi   Objective   Vitals as reported by the patient: There were no vitals filed for this visit.  Joshua Perkins was seen today for follow-up.  Diagnoses and all orders for this visit:  Mixed hyperlipidemia -     Comprehensive metabolic panel -     Lipid panel -     Hemoglobin A1c  Concentration deficit    PLAN Future labs placed. Follow up as warranted Advised to seek work up with [employer]. Info given. Advised to have 2023 CPE. Patient encouraged to call clinic with any questions, comments, or concerns.   I discussed the assessment and treatment plan with the  patient. The patient was provided an opportunity to ask questions and all were answered. The patient agreed with the plan and demonstrated an understanding of the instructions.   The patient was advised to call back or seek an in-person evaluation if the symptoms worsen or if the condition fails to improve as anticipated.  I provided 15 minutes of non-face-to-face time during this encounter.  Joshua Agee, NP

## 2022-01-11 ENCOUNTER — Other Ambulatory Visit (INDEPENDENT_AMBULATORY_CARE_PROVIDER_SITE_OTHER): Payer: 59

## 2022-01-11 DIAGNOSIS — E782 Mixed hyperlipidemia: Secondary | ICD-10-CM | POA: Diagnosis not present

## 2022-01-11 LAB — LIPID PANEL
Cholesterol: 233 mg/dL — ABNORMAL HIGH (ref 0–200)
HDL: 50.4 mg/dL (ref >39.00)
LDL Cholesterol: 162 mg/dL — ABNORMAL HIGH (ref 0–99)
NonHDL: 183
Total CHOL/HDL Ratio: 5
Triglycerides: 105 mg/dL (ref 0.0–149.0)
VLDL: 21 mg/dL (ref 0.0–40.0)

## 2022-01-11 LAB — COMPREHENSIVE METABOLIC PANEL
ALT: 25 U/L (ref 0–53)
AST: 24 U/L (ref 0–37)
Albumin: 4.1 g/dL (ref 3.5–5.2)
Alkaline Phosphatase: 47 U/L (ref 39–117)
BUN: 14 mg/dL (ref 6–23)
CO2: 28 mEq/L (ref 19–32)
Calcium: 8.9 mg/dL (ref 8.4–10.5)
Chloride: 105 mEq/L (ref 96–112)
Creatinine, Ser: 1.44 mg/dL (ref 0.40–1.50)
GFR: 62.57 mL/min (ref >60.00)
Glucose, Bld: 85 mg/dL (ref 70–99)
Potassium: 4.2 mEq/L (ref 3.5–5.1)
Sodium: 140 mEq/L (ref 135–145)
Total Bilirubin: 0.4 mg/dL (ref 0.2–1.2)
Total Protein: 6.9 g/dL (ref 6.0–8.3)

## 2022-01-11 LAB — HEMOGLOBIN A1C: Hgb A1c MFr Bld: 5.7 % (ref 4.6–6.5)

## 2022-01-11 NOTE — Addendum Note (Signed)
Addended by: Eldred Manges on: 01/11/2022 08:07 AM   Modules accepted: Orders

## 2022-02-12 ENCOUNTER — Ambulatory Visit: Payer: 59 | Admitting: Internal Medicine

## 2022-02-12 ENCOUNTER — Encounter: Payer: Self-pay | Admitting: Internal Medicine

## 2022-02-12 VITALS — BP 130/78 | HR 82 | Temp 98.0°F | Resp 12 | Ht 72.0 in | Wt 260.2 lb

## 2022-02-12 DIAGNOSIS — R41840 Attention and concentration deficit: Secondary | ICD-10-CM | POA: Diagnosis not present

## 2022-02-12 DIAGNOSIS — R0683 Snoring: Secondary | ICD-10-CM | POA: Diagnosis not present

## 2022-02-12 DIAGNOSIS — R7303 Prediabetes: Secondary | ICD-10-CM | POA: Diagnosis not present

## 2022-02-12 MED ORDER — BUPROPION HCL ER (XL) 150 MG PO TB24: 150.0000 mg | ORAL_TABLET | Freq: Every day | ORAL | 3 refills | Status: AC

## 2022-02-12 NOTE — Patient Instructions (Signed)
Ketogenic Eating Plan, Adult A ketogenic eating plan is a diet that is very low in carbohydrates, moderately low in protein, and very high in fat. Your body normally gets energy from eating carbohydrates. If you limit carbohydrates in your diet, your body will start to use stored fat as an energy source. When fat is broken down for energy, it enters your blood as a substance called ketones. A ketogenic eating plan relies mostly on ketones for energy. This eating plan can cause rapid weight loss because the body burns fat for fuel. What are the benefits of a ketogenic eating plan? Epilepsy Ketogenic eating plans have been well studied as a treatment for epilepsy in children and have been used for many years. These plans have been studied to treat epilepsy in adults too. You will want to work closely with a dietitian if your health care provider suggests this eating plan to manage your seizures. Other conditions Ketogenic eating plans have also been studied to help treat other conditions, including: Cancer. Type 2 diabetes. Alzheimer's disease. Parkinson's disease. Autism. Multiple sclerosis. More long-term studies are needed to learn the effects of this eating plan on people with these and other conditions. What are the side effects of a ketogenic eating plan? Side effects of a ketogenic diet may include: Vitamin deficiencies. Dehydration. Bad breath. Nausea. Hunger. Constipation. Problems with menstrual periods. Inflammation of the pancreas. Kidney stones or gallbladder stones. Bone fractures. High cholesterol. What are tips for following this plan? Reading food labels Look for foods that have a low glycemic index (GI) label. Read a product's ingredient list to check for hidden or added sugar. Check food labels for the number of grams of carbohydrates and protein in each serving. This is important. Cooking Carefully measure or weigh foods. Make desserts using ketogenic or low GI  recipes. Avoid cooking with sauces that have added sugar, such as barbecue sauce or ketchup. Meal planning There are several versions of ketogenic eating plans. The classic ketogenic diet recommends that 90% of your calories come from fat, 6% from protein, and 4% from carbohydrates. Aim for a daily meal and snack schedule that you can follow steadily. Every meal will include high-fat items, such as avocado, cream, butter, or mayonnaise. Each day, you should have: Fat: ______as much as you want of omega 3, limit saturated fat____g. Protein: ____as much as you want_____g. Carbohydrates: ______under 50___g. General information  Buy a gram scale to weigh foods. This is needed to follow this diet correctly. Your dietitian will teach you how to use it. Ask your health care provider what steps you can take to avoid side effects of this eating plan, such as constipation, dehydration, and kidney stones. Take vitamin and mineral supplements only as told by your health care provider or dietitian. Work with your dietitian and health care provider to help you stay on track. What foods should I eat?  Fruits Fresh blueberries, blackberries, or raspberries. Other fresh and frozen fruits in small amounts. Vegetables Lettuce. Bok choy. Eggplant. Tomatoes. Cucumbers. Peppers. Cauliflower. Zucchini. Fennel. Swiss chard. Grains Almond, hazelnut, or coconut flours. Meats and other proteins Meat, poultry, and fish. Bacon. Eggs. Egg substitutes. Nuts, nut butters (without added sugar), seeds, lentils, and split peas in small amounts. Dairy Cheese in moderate amounts. Beverages Plain or mineral water. Sugar-free, caffeine-free beverages. Club soda. Caffeine-free, carbohydrate-free herbal tea. Fats and oils Avocado. Cream. Sour cream. Cream cheese. Butter. Plant-based oils, such as olive, canola, and sunflower. Margarine. Mayonnaise. Sweets and desserts Any homemade sweets  or desserts made using ketogenic  diet recipes. The items listed above may not be a complete list of recommended foods and beverages. Contact a dietitian for more information. What foods should I avoid? Fruits Fruit juice. Fruits packed in syrups. Dried or candied fruits. Vegetables Corn. Potatoes (all kinds). Peas. Grains All bread, dry cereals, and cooked cereals with added sugar. Baked goods. Crackers and pretzels. Meats and other proteins Meat, poultry, or fish prepared with flour or breading. Nut butters with added sugar. Beans. Dairy Milk. Yogurt. Fats and oils Salad dressings with added sugar. Gravies. Beverages Sugar-sweetened teas, coffee drinks, or soft drinks. Juice. Sports drinks. Sweets and desserts All sweets and desserts, unless the dessert is homemade using ketogenic diet recipes. The items listed above may not be a complete list of foods and beverages to avoid. Contact a dietitian for more information. Summary A ketogenic eating plan is a diet that is very low in carbohydrates, moderately low in protein, and very high in fat. Aim for a daily meal and snack schedule that you can follow steadily. Buy a gram scale to weigh foods. This is needed to follow this diet correctly. Your dietitian will teach you how to use it. Work closely with your health care provider and a dietitian while you are following a ketogenic eating plan. This information is not intended to replace advice given to you by your health care provider. Make sure you discuss any questions you have with your health care provider. Document Revised: 08/27/2019 Document Reviewed: 08/27/2019 Elsevier Patient Education  2023 [employer]. It was a pleasure seeing you today!  Today the plan is...  Snoring Assessment & Plan: Urged him to use the snore lab and if he does not like his results to call me and I will send in a sleep study referral for him to get it repeated and see if he needs to be on CPAP again   Concentration  deficit Assessment & Plan: Discussion where I advised him I believe that he likely has ADHD and not sleep apnea but he will need to confirm by doing snore lab to confirm that the snoring and apnea is well controlled presently with the previous weight loss as well as go to the [facility] to get confirm diagnosis of adult ADHD as I do not do the diagnostic evaluation here.     Prediabetes     Joshua Olszewski, MD   No follow-ups on file.   - If your condition fails to resolve or you have other questions / concerns: please contact me via phone [phone] or MyChart messaging.  - Please bring all your medicines to your next appointment. This is the best way for me to know exactly what you're taking.  - If your condition begins to worsen or become severe:  go to the ER.   IF you received an x-ray today, you will receive an invoice from [facility]. Please contact [facility] at [phone] with questions or concerns regarding your invoice.    IF you received labwork today, you will receive an invoice from [place]. Please contact [facility] at [phone] with questions or concerns regarding your invoice.    Our billing staff will not be able to assist you with questions regarding bills from these companies.   --------------------------------------------------------------------------------------------------------------------  You will be contacted with the lab results as soon as they are available. The fastest way to get your results is to activate your My Chart account. Instructions are located on the  last page of this paperwork. If you have not heard from [place] regarding the results in 2 weeks, please contact this office. For any labs or imaging tests, we will call you if the results are significantly abnormal.  Most normal results will be posted to myChart as soon as they are available and I will comment on them there within 2-3 business days.

## 2022-02-12 NOTE — Progress Notes (Signed)
Today's healthcare provider: Loralee Pacas, MD  Phone: [phone]  New patient visit  Visit Date: 02/12/2022 Patient: Joshua Perkins   DOB: 1986/03/26   36 y.o. Male  MRN: [id number]  Assessment and Plan:     Joshua Perkins was seen today for transfer of care.  Concentration deficit Assessment & Plan: Discussion where I advised him I believe that he likely has ADHD and not sleep apnea but he will need to confirm by doing snore lab to confirm that the snoring and apnea is well controlled presently with the previous weight loss as well as go to the [place] [facility] to get confirm diagnosis of adult ADHD as I do not do the diagnostic evaluation here.    Orders: -     buPROPion HCl ER (XL); Take 1 tablet (150 mg total) by mouth daily.  Dispense: 90 tablet; Refill: 3 -     Neuropsychological assessment; Future  Snoring Overview: Used to also have sleep apnea and he went on an exercise program got healthier and this has almost completely gone away although he still occasionally snores  Assessment & Plan: Joshua Perkins him to use the snore lab and if he does not like his results to call me and I will send in a sleep study referral for him to get it repeated and see if he needs to be on CPAP again   Prediabetes Overview: Lab Results  Component Value Date   HGBA1C 5.7 01/11/2022   HGBA1C 5.6 10/21/2019       Health Maintenance  Topic Date Due   INFLUENZA VACCINE  11/28/2021   TETANUS/TDAP  11/30/2022 (Originally 08/26/2004)   Hepatitis C Screening  Completed   HIV Screening  Completed   HPV VACCINES  Aged Out   COVID-19 Vaccine  Discontinued    Recommended follow up:  No follow-ups on file.   Subjective:  Patient presents today to establish care.  Prior patient of Orland Mustard.  Chief Complaint  Patient presents with   Transfer of care    Wants assessment for ADHD.   For history taking, I took a per problem history from the patient and chart review as follows: Problem   Prediabetes   Lab Results  Component Value Date   HGBA1C 5.7 01/11/2022   HGBA1C 5.6 10/21/2019     Concentration Deficit  Snoring   Used to also have sleep apnea and he went on an exercise program got healthier and this has almost completely gone away although he still occasionally snores      Depression Screen    02/12/2022    3:32 PM 11/29/2021    1:32 PM 03/18/2020    8:37 AM 10/21/2019    3:40 PM  PHQ 2/9 Scores  PHQ - 2 Score 0 0 0 0  PHQ- 9 Score  0     No results found for any visits on 02/12/22.   The following were reviewed and entered/updated in epic: History reviewed. No pertinent past medical history. History reviewed. No pertinent surgical history. History reviewed. No pertinent surgical history. No family status information on file.   History reviewed. No pertinent family history. No outpatient medications prior to visit.   No facility-administered medications prior to visit.    No Known Allergies Social History   Tobacco Use   Smoking status: Never   Smokeless tobacco: Never  Substance Use Topics   Alcohol use: Yes   Drug use: No    Immunization History  Administered Date(s) Administered   Influenza,inj,Quad PF,6+  Mos 03/03/2020   PFIZER(Purple Top)SARS-COV-2 Vaccination 06/19/2019, 07/14/2019   PPD Test 03/14/2016      Objective:  BP 130/78 (BP Location: Right Arm, Patient Position: Sitting)   Pulse 82   Temp 98 F (36.7 C) (Temporal)   Resp 12   Ht 6' (1.829 m)   Wt 260 lb 3.2 oz (118 kg)   SpO2 97%   BMI 35.29 kg/m  Body mass index is 35.29 kg/m.  He  is a very cordial and polite person who was a pleasure to meet. Quite gentlemanly and muscular. About 18" neck circ. Gen: NAD, resting comfortably, HEENT: Mucous membranes are moist. Sclera conjunctiva and lids grossly normal Neck: no thyromegaly, no cervical lymphadenopathy CV: RRR no murmurs rubs or gallops Lungs: CTAB no crackles, wheeze, rhonchi Skin: warm, dry Neuro:  grossly intact  No images are attached to the encounter or orders placed in the encounter.  Results for orders placed or performed in visit on 01/11/22  Comprehensive metabolic panel  Result Value Ref Range   Sodium 140 135 - 145 mEq/L   Potassium 4.2 3.5 - 5.1 mEq/L   Chloride 105 96 - 112 mEq/L   CO2 28 19 - 32 mEq/L   Glucose, Bld 85 70 - 99 mg/dL   BUN 14 6 - 23 mg/dL   Creatinine, Ser 3.77 0.40 - 1.50 mg/dL   Total Bilirubin 0.4 0.2 - 1.2 mg/dL   Alkaline Phosphatase 47 39 - 117 U/L   AST 24 0 - 37 U/L   ALT 25 0 - 53 U/L   Total Protein 6.9 6.0 - 8.3 g/dL   Albumin 4.1 3.5 - 5.2 g/dL   GFR 93.96 >88.64 mL/min   Calcium 8.9 8.4 - 10.5 mg/dL  Lipid panel  Result Value Ref Range   Cholesterol 233 (H) 0 - 200 mg/dL   Triglycerides 847.2 0.0 - 149.0 mg/dL   HDL 07.21 >82.88 mg/dL   VLDL 33.7 0.0 - 44.5 mg/dL   LDL Cholesterol 146 (H) 0 - 99 mg/dL   Total CHOL/HDL Ratio 5    NonHDL 183.00   Hemoglobin A1c  Result Value Ref Range   Hgb A1c MFr Bld 5.7 4.6 - 6.5 %

## 2022-02-12 NOTE — Assessment & Plan Note (Signed)
Urged him to use the snore lab and if he does not like his results to call me and I will send in a sleep study referral for him to get it repeated and see if he needs to be on CPAP again

## 2022-02-12 NOTE — Assessment & Plan Note (Signed)
Discussion where I advised him I believe that he likely has ADHD and not sleep apnea but he will need to confirm by doing snore lab to confirm that the snoring and apnea is well controlled presently with the previous weight loss as well as go to the [facility] to get confirm diagnosis of adult ADHD as I do not do the diagnostic evaluation here.

## 2022-04-05 ENCOUNTER — Encounter: Payer: Self-pay | Admitting: Adult Health

## 2022-04-05 ENCOUNTER — Ambulatory Visit (INDEPENDENT_AMBULATORY_CARE_PROVIDER_SITE_OTHER): Payer: 59 | Admitting: Adult Health

## 2022-04-05 VITALS — BP 110/82 | HR 72 | Ht 72.0 in | Wt 251.6 lb

## 2022-04-05 DIAGNOSIS — G4733 Obstructive sleep apnea (adult) (pediatric): Secondary | ICD-10-CM

## 2022-04-05 NOTE — Patient Instructions (Addendum)
Referral for oral appliance for sleep apnea.  Dr. Irene Limbo [phone]) Healthy weight loss.  Do not drive if sleepy  Follow up in 6 months with Dr. Maple Hudson or Arlynn Mcdermid NP and As needed

## 2022-04-05 NOTE — Assessment & Plan Note (Signed)
Mild OSA - reviewed in detail . Wants to try oral appliance .  - discussed how weight can impact sleep and risk for sleep disordered breathing - discussed options to assist with weight loss: combination of diet modification, cardiovascular and strength training exercises   - had an extensive discussion regarding the adverse health consequences related to untreated sleep disordered breathing - specifically discussed the risks for hypertension, coronary artery disease, cardiac dysrhythmias, cerebrovascular disease, and diabetes - lifestyle modification discussed   - discussed how sleep disruption can increase risk of accidents, particularly when driving - safe driving practices were discussed     Plan  Patient Instructions  Referral for oral appliance for sleep apnea.  Dr. Irene Limbo [phone]) Healthy weight loss.  Do not drive if sleepy  Follow up in 6 months with Dr. Maple Hudson or Abdulrahman Bracey NP and As needed

## 2022-04-05 NOTE — Progress Notes (Signed)
@Patient  ID: , male    DOB: October 02, 1985, 36 y.o.   MRN: [id number]  Chief Complaint  Patient presents with   Follow-up    Referring provider: [id number], MD  HPI: 36 year old male seen for sleep consult March 03, 2020 for snoring, daytime sleepiness and witnessed apneic events found to have sleep apnea  TEST/EVENTS :  04/02/2020 Sleep study showed obstructive sleep apnea averaging about 14 apneas/ hour, with drops in blood oxygen levels.   04/05/2022 Follow up : OSA  Patient returns for a follow-up visit.  Patient was seen last in November 2021 for snoring, daytime sleepiness and witnessed apneic events.  Patient was set up for home sleep study that was completed April 02, 2020 that showed mild obstructive sleep apnea with a AHI of 14/hour.  Patient was recommended begin CPAP.  Patient stated he started to workout and lose weight . Daytime sleepiness is better but continues to snore loudly . Disruptive for spouse . We discussed treatment options. He would like to try oral appliance .   Exercising a lot now , doing cross fit.    No Known Allergies  Immunization History  Administered Date(s) Administered   Influenza,inj,Quad PF,6+ Mos 03/03/2020   PFIZER(Purple Top)SARS-COV-2 Vaccination 06/19/2019, 07/14/2019   PPD Test 03/14/2016    No past medical history on file.  Tobacco History: Social History   Tobacco Use  Smoking Status Never  Smokeless Tobacco Never   Counseling given: Not Answered   Outpatient Medications Prior to Visit  Medication Sig Dispense Refill   buPROPion (WELLBUTRIN XL) 150 MG 24 hr tablet Take 1 tablet (150 mg total) by mouth daily. 90 tablet 3   No facility-administered medications prior to visit.     Review of Systems:   Constitutional:   No  weight loss, night sweats,  Fevers, chills, fatigue, or  lassitude.  HEENT:   No headaches,  Difficulty swallowing,  Tooth/dental problems, or  Sore throat,                No  sneezing, itching, ear ache, nasal congestion, post nasal drip,   CV:  No chest pain,  Orthopnea, PND, swelling in lower extremities, anasarca, dizziness, palpitations, syncope.   GI  No heartburn, indigestion, abdominal pain, nausea, vomiting, diarrhea, change in bowel habits, loss of appetite, bloody stools.   Resp: No shortness of breath with exertion or at rest.  No excess mucus, no productive cough,  No non-productive cough,  No coughing up of blood.  No change in color of mucus.  No wheezing.  No chest wall deformity  Skin: no rash or lesions.  GU: no dysuria, change in color of urine, no urgency or frequency.  No flank pain, no hematuria   MS:  No joint pain or swelling.  No decreased range of motion.  No back pain.    Physical Exam  BP 110/82 (BP Location: Left Arm, Cuff Size: Large)   Pulse 72   Ht 6' (1.829 m)   Wt 251 lb 9.6 oz (114.1 kg)   SpO2 98%   BMI 34.12 kg/m   GEN: A/Ox3; pleasant , NAD, well nourished    HEENT:  [place]/AT, , NOSE-clear, THROAT-clear, no lesions, no postnasal drip or exudate noted. Class 2-3 MP airway , elongated uvula   NECK:  Supple w/ fair ROM; no JVD; normal carotid impulses w/o bruits; no thyromegaly or nodules palpated; no lymphadenopathy.    RESP  Clear  P & A; w/o, wheezes/  rales/ or rhonchi. no accessory muscle use, no dullness to percussion  CARD:  RRR, no m/r/g, no peripheral edema, pulses intact, no cyanosis or clubbing.  GI:   Soft & nt; nml bowel sounds; no organomegaly or masses detected.   Musco: Warm bil, no deformities or joint swelling noted.   Neuro: alert, no focal deficits noted.    Skin: Warm, no lesions or rashes    Lab Results:  CBC   BNP No results found for: "BNP"  ProBNP No results found for: "PROBNP"  Imaging: No results found.        No data to display          No results found for: "NITRICOXIDE"      Assessment & Plan:   OSA (obstructive sleep apnea) Mild OSA - reviewed in  detail . Wants to try oral appliance .  - discussed how weight can impact sleep and risk for sleep disordered breathing - discussed options to assist with weight loss: combination of diet modification, cardiovascular and strength training exercises   - had an extensive discussion regarding the adverse health consequences related to untreated sleep disordered breathing - specifically discussed the risks for hypertension, coronary artery disease, cardiac dysrhythmias, cerebrovascular disease, and diabetes - lifestyle modification discussed   - discussed how sleep disruption can increase risk of accidents, particularly when driving - safe driving practices were discussed     Plan  Patient Instructions  Referral for oral appliance for sleep apnea.  Dr. Irene Limbo [phone]) Healthy weight loss.  Do not drive if sleepy  Follow up in 6 months with Dr. Maple Hudson or Mileydi Milsap NP and As needed        Rubye Oaks, NP 04/05/2022

## 2022-04-12 ENCOUNTER — Encounter: Payer: Self-pay | Admitting: *Deleted

## 2022-05-29 ENCOUNTER — Ambulatory Visit: Payer: 59 | Admitting: Adult Health

## 2022-06-08 ENCOUNTER — Ambulatory Visit (INDEPENDENT_AMBULATORY_CARE_PROVIDER_SITE_OTHER): Payer: 59 | Admitting: Adult Health

## 2022-06-08 ENCOUNTER — Encounter: Payer: Self-pay | Admitting: Adult Health

## 2022-06-08 VITALS — BP 118/80 | HR 96 | Temp 97.6°F | Ht 72.0 in | Wt 252.0 lb

## 2022-06-08 DIAGNOSIS — G4733 Obstructive sleep apnea (adult) (pediatric): Secondary | ICD-10-CM

## 2022-06-08 NOTE — Progress Notes (Signed)
$@PatientY$  ID: Joshua Perkins, male    DOB: 12/08/1985, 37 y.o.   MRN: [id number]  Chief Complaint  Patient presents with   Follow-up    Referring provider: Loralee Pacas, MD  HPI: 37 year old male seen for sleep consult March 03, 2020 for snoring, daytime sleepiness and witnessed apneic events found to have sleep apnea  TEST/EVENTS :  04/02/2020 Sleep study showed obstructive sleep apnea averaging about 14 apneas/ hour, with drops in blood oxygen levels.    06/08/2022 Follow up ; OSA  Patient presents for a follow-up visit.  Patient has underlying mild sleep apnea.  Has snoring and daytime sleepiness.  Last visit we referred for an oral appliance unfortunately this is cost prohibitive.  We went over other treatment options including CPAP therapy and weight loss.  Patient says he continues to have severe sleep snoring that is interfering with his sleep and his spouse asleep.  He has daytime sleepiness and restless sleep.  We went over CPAP therapy in detail.    No Known Allergies  Immunization History  Administered Date(s) Administered   Influenza,inj,Quad PF,6+ Mos 03/03/2020   PFIZER(Purple Top)SARS-COV-2 Vaccination 06/19/2019, 07/14/2019   PPD Test 03/14/2016    History reviewed. No pertinent past medical history.  Tobacco History: Social History   Tobacco Use  Smoking Status Never  Smokeless Tobacco Never   Counseling given: Not Answered   Outpatient Medications Prior to Visit  Medication Sig Dispense Refill   amphetamine-dextroamphetamine (ADDERALL XR) 15 MG 24 hr capsule Take by mouth daily.     amphetamine-dextroamphetamine (ADDERALL XR) 20 MG 24 hr capsule Take 20 mg by mouth every morning.     buPROPion (WELLBUTRIN XL) 150 MG 24 hr tablet Take 1 tablet (150 mg total) by mouth daily. 90 tablet 3   No facility-administered medications prior to visit.     Review of Systems:   Constitutional:   No  weight loss, night sweats,  Fevers, chills, fatigue, or   lassitude.  HEENT:   No headaches,  Difficulty swallowing,  Tooth/dental problems, or  Sore throat,                No sneezing, itching, ear ache, nasal congestion, post nasal drip,   CV:  No chest pain,  Orthopnea, PND, swelling in lower extremities, anasarca, dizziness, palpitations, syncope.   GI  No heartburn, indigestion, abdominal pain, nausea, vomiting, diarrhea, change in bowel habits, loss of appetite, bloody stools.   Resp: No shortness of breath with exertion or at rest.  No excess mucus, no productive cough,  No non-productive cough,  No coughing up of blood.  No change in color of mucus.  No wheezing.  No chest wall deformity  Skin: no rash or lesions.  GU: no dysuria, change in color of urine, no urgency or frequency.  No flank pain, no hematuria   MS:  No joint pain or swelling.  No decreased range of motion.  No back pain.    Physical Exam  BP 118/80 (BP Location: Left Arm, Patient Position: Sitting, Cuff Size: Normal)   Pulse 96   Temp 97.6 F (36.4 C) (Temporal)   Ht 6' (1.829 m)   Wt 252 lb (114.3 kg)   SpO2 99%   BMI 34.18 kg/m   GEN: A/Ox3; pleasant , NAD, well nourished    HEENT:  [place]/AT,  EACs-clear, TMs-wnl, NOSE-clear, THROAT-clear, no lesions, no postnasal drip or exudate noted.  Class 2 MP airway   NECK:  Supple w/ fair  ROM; no JVD; normal carotid impulses w/o bruits; no thyromegaly or nodules palpated; no lymphadenopathy.    RESP  Clear  P & A; w/o, wheezes/ rales/ or rhonchi. no accessory muscle use, no dullness to percussion  CARD:  RRR, no m/r/g, no peripheral edema, pulses intact, no cyanosis or clubbing.  GI:   Soft & nt; nml bowel sounds; no organomegaly or masses detected.   Musco: Warm bil, no deformities or joint swelling noted.   Neuro: alert, no focal deficits noted.    Skin: Warm, no lesions or rashes    Lab Results:    BMET   BNP No results found for: "BNP"  ProBNP No results found for: "PROBNP"  Imaging: No  results found.        No data to display          No results found for: "NITRICOXIDE"      Assessment & Plan:   OSA (obstructive sleep apnea) Mild obstructive sleep apnea with significant symptom burden of snoring, restless sleep and daytime sleepiness.  Oral appliance evaluation was cost prohibitive.  Will proceed with CPAP therapy begin auto CPAP 5 to 15 cm H2O.  Plan  Patient Instructions  Begin CPAP At bedtime,  Healthy weight loss.  Do not drive if sleepy  Follow up in months with Dr. Annamaria Boots or Danitra Payano NP and As needed        Rexene Edison, NP 06/08/2022

## 2022-06-08 NOTE — Patient Instructions (Addendum)
Begin CPAP At bedtime,  Healthy weight loss.  Do not drive if sleepy  Follow up in months with Dr. Annamaria Boots or Chase Knebel NP and As needed

## 2022-06-08 NOTE — Assessment & Plan Note (Signed)
Mild obstructive sleep apnea with significant symptom burden of snoring, restless sleep and daytime sleepiness.  Oral appliance evaluation was cost prohibitive.  Will proceed with CPAP therapy begin auto CPAP 5 to 15 cm H2O.  Plan  Patient Instructions  Begin CPAP At bedtime,  Healthy weight loss.  Do not drive if sleepy  Follow up in months with Dr. Annamaria Boots or Wessie Shanks NP and As needed

## 2022-06-08 NOTE — Addendum Note (Signed)
Addended by: June Leap on: 06/08/2022 04:33 PM   Modules accepted: Orders

## 2022-06-11 ENCOUNTER — Encounter: Payer: Self-pay | Admitting: Internal Medicine

## 2022-06-11 DIAGNOSIS — G2581 Restless legs syndrome: Secondary | ICD-10-CM | POA: Insufficient documentation

## 2022-08-25 IMAGING — CR DG CHEST 1V
1 series · 1 of 1 positions shown · non-contrast
Comparison: Radiograph 10/27/2007

CLINICAL DATA: PRE EMPLOYMENT, cough intermittent for a couple of
months

EXAM:
CHEST  1 VIEW

[w chest pa]
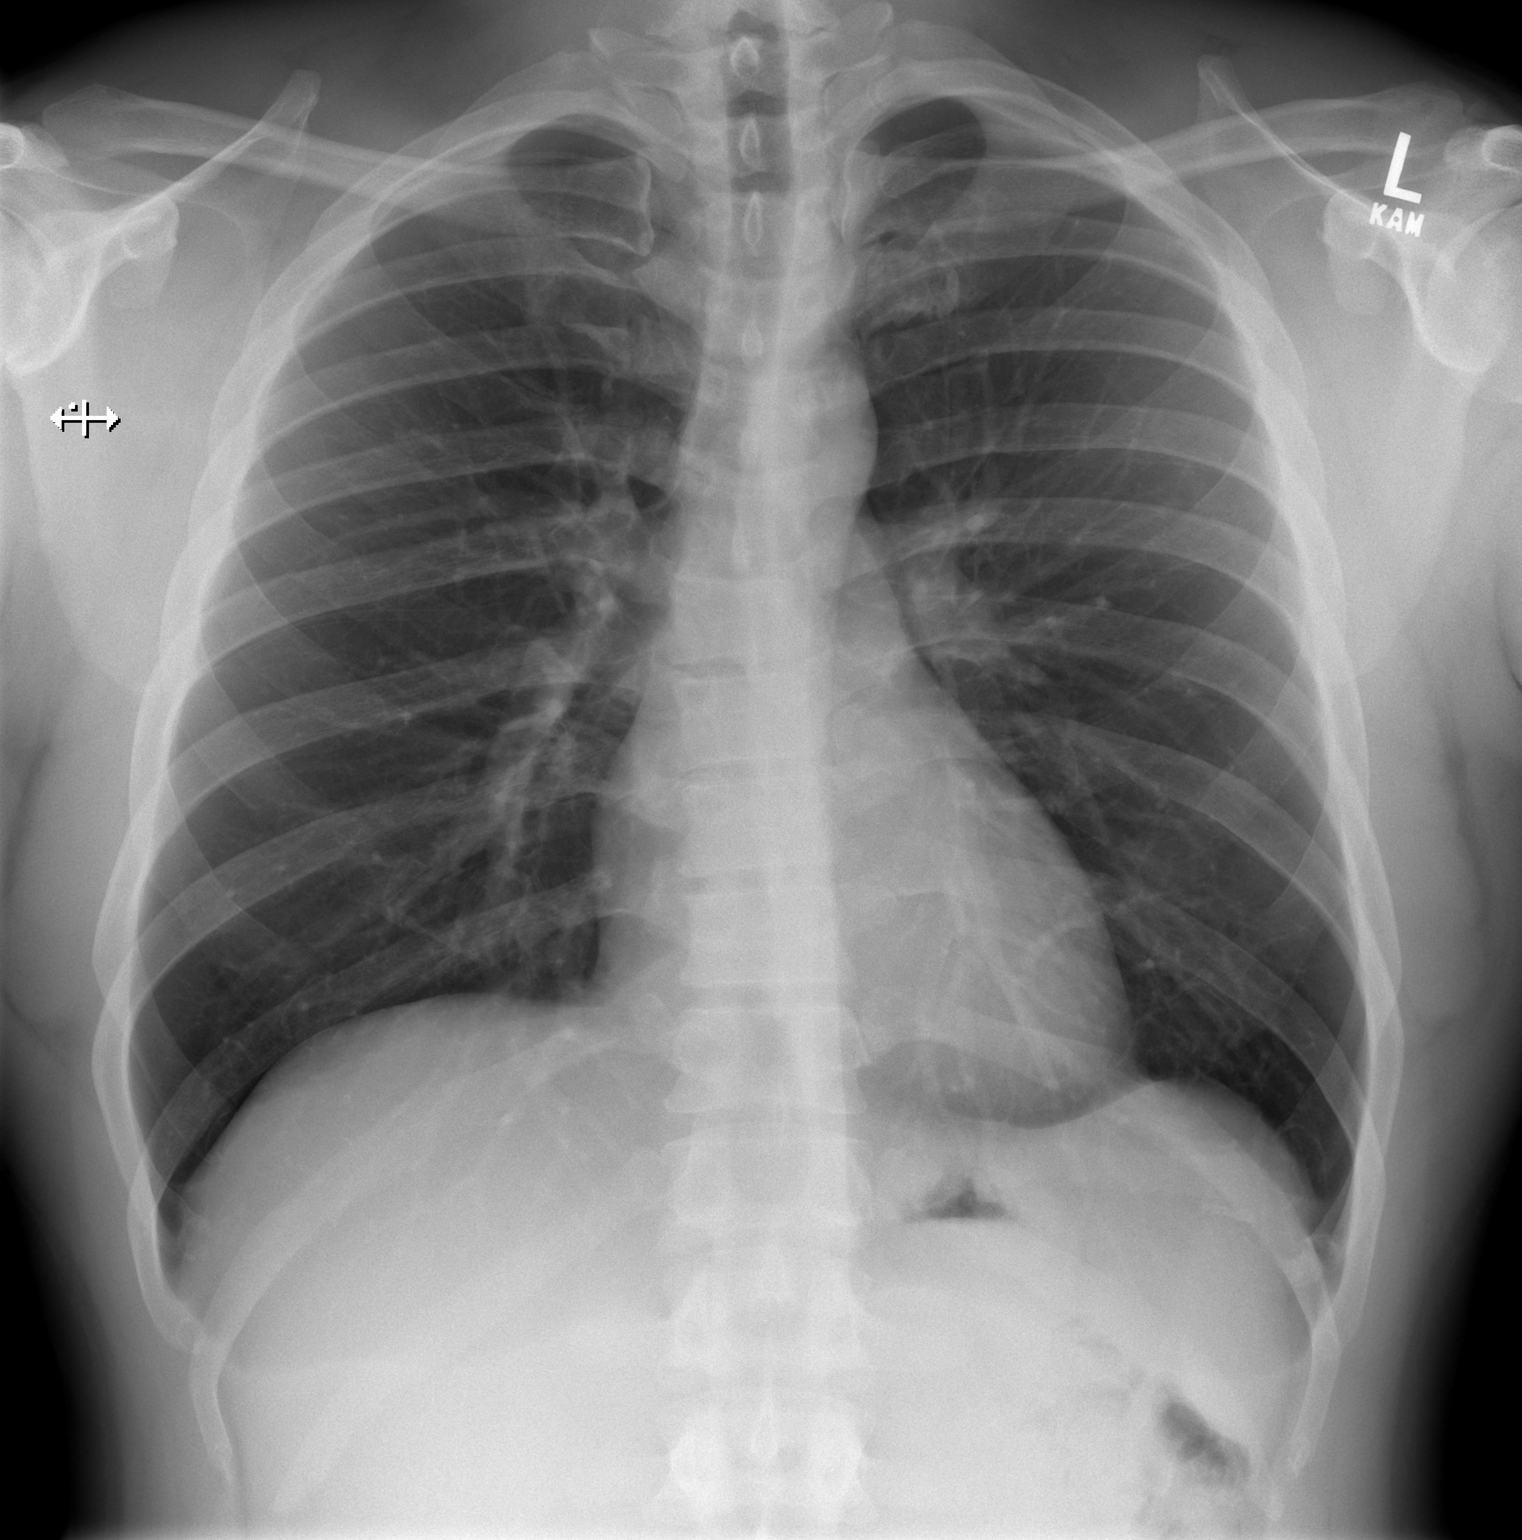

[1 of 1 positions shown; findings below may reference images not displayed]

FINDINGS: The heart size and mediastinal contours are within normal limits.No
focal airspace disease. No pleural effusion or pneumothorax.No acute
osseous abnormality.
IMPRESSION: No evidence of acute cardiopulmonary disease.

## 2022-09-05 NOTE — Progress Notes (Deleted)
03/03/20- 37 yo M never smoker for sleep evaluation courtesy of Janeece Agee, NP with concern of witnessed apnea. Works Doctor, general practice, night shift,  Medical problem list includes- none reported Epworth score- 8 Body weight today- 263 lbs Covid vax- 2 Phizer Flu vax- standard -----pt has nver had sleep consult and pt states snoring and stop breathing in sleep Wife complains of his snoring and witnessed apnea.  He tries to maintain appropriate daytime sleep environment- discussed. Occasionally tired at work.  No sleep meds. Occasional energy drink. Parents were loud snorers. Denies ENT surgery, heart, lung or neurologic problems. No parasomnias.   09/07/22- 37 yoM never smoker followed for OSA, Circadian Rhythm disorder, Restless Legs, Hyperlipidemia HST 04/02/20- AHI 13.9/ hr, desaturation to 83%, body weight 263 lbs OAP too expensive. Disruptive loud snoring -Adderall 15 1 daily LOV Parrett, NP 06/08/22- ordered CPAP auto 5-15/ [employer] Download compliance- Body weight today-   ROS-see HPI  + = positive Constitutional:    weight loss, night sweats, fevers, chills, fatigue, lassitude. HEENT:    headaches, difficulty swallowing, tooth/dental problems, sore throat,       sneezing, itching, ear ache, nasal congestion, post nasal drip, snoring CV:    chest pain, orthopnea, PND, swelling in lower extremities, anasarca,                                  dizziness, palpitations Resp:   shortness of breath with exertion or at rest.                productive cough,   non-productive cough, coughing up of blood.              change in color of mucus.  wheezing.   Skin:    rash or lesions. GI:  No-   heartburn, indigestion, abdominal pain, nausea, vomiting, diarrhea,                 change in bowel habits, loss of appetite GU: dysuria, change in color of urine, no urgency or frequency.   flank pain. MS:   joint pain, stiffness, decreased range of motion, back pain. Neuro-     nothing  unusual Psych:  change in mood or affect.  depression or anxiety.   memory loss.  OBJ- Physical Exam General- Alert, Oriented, Affect-appropriate, Distress- none acute, +overweight/ muscular Skin- rash-none, lesions- none, excoriation- none Lymphadenopathy- none Head- atraumatic            Eyes- Gross vision intact, PERRLA, conjunctivae and secretions clear            Ears- Hearing, canals-normal            Nose- Clear, no-Septal dev, mucus, polyps, erosion, perforation             Throat- Mallampati II -III, mucosa clear , drainage- none, tonsils- atrophic, + teeth Neck- flexible , trachea midline, no stridor , thyroid nl, carotid no bruit Chest - symmetrical excursion , unlabored           Heart/CV- RRR , no murmur , no gallop  , no rub, nl s1 s2                           - JVD- none , edema- none, stasis changes- none, varices- none           Lung- clear to P&A, wheeze- none, cough-  none , dullness-none, rub- none           Chest wall-  Abd-  Br/ Gen/ Rectal- Not done, not indicated Extrem- cyanosis- none, clubbing, none, atrophy- none, strength- nl Neuro- grossly intact to observation

## 2022-09-07 ENCOUNTER — Ambulatory Visit: Payer: 59 | Admitting: Internal Medicine

## 2022-09-20 ENCOUNTER — Encounter: Payer: Self-pay | Admitting: Internal Medicine

## 2022-12-24 ENCOUNTER — Ambulatory Visit: Payer: 59 | Admitting: Adult Health

## 2022-12-24 ENCOUNTER — Encounter: Payer: Self-pay | Admitting: Adult Health

## 2022-12-24 VITALS — BP 130/86 | HR 64 | Ht 72.0 in | Wt 247.0 lb

## 2022-12-24 DIAGNOSIS — G4733 Obstructive sleep apnea (adult) (pediatric): Secondary | ICD-10-CM | POA: Diagnosis not present

## 2022-12-24 NOTE — Patient Instructions (Addendum)
Continue on CPAP At bedtime, wear all night long for at least 6hr or more .  May like Dreamwear nasal pillows.  Change CPAP pressure 8 to 15cmH2O Healthy weight loss.  Do not drive if sleepy  Follow up in 4-6 months with Dr. Maple Hudson or Josiephine Simao NP and As needed

## 2022-12-24 NOTE — Progress Notes (Unsigned)
@Patient  ID: Joshua Perkins, male    DOB: November 18, 1985, 37 y.o.   MRN: [id number]  Chief Complaint  Patient presents with   Follow-up    Referring provider: Lula Olszewski, MD  HPI: 37 year old male seen for sleep consult March 03, 2020 for snoring found to have sleep apnea  TEST/EVENTS :  06/04/2019 Sleep study showed obstructive sleep apnea averaging about 14 apneas/ hour   12/24/2022 Follow up : OSA Patient presents for a 82-month follow-up.  Patient is followed for mild obstructive sleep apnea.  He is on nocturnal CPAP.  Patient says he is doing well on CPAP.  He trying to wear it each night.  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  Does feel that he is benefiting from CPAP as his daytime sleepiness has been decreased feels that he is getting a better night sleep.  And feels more rested.  Download shows excellent compliance with daily average usage at 4 hours.  AHI 1.3 /hour.  Daily average pressure at 12 cm H2O.  He is currently using DreamWear nasal mask.  Does feel that pressure is not enough especially at the beginning of the night.  No Known Allergies  Immunization History  Administered Date(s) Administered   Influenza,inj,Quad PF,6+ Mos 03/03/2020   PFIZER(Purple Top)SARS-COV-2 Vaccination 06/19/2019, 07/14/2019   PPD Test 03/14/2016    No past medical history on file.  Tobacco History: Social History   Tobacco Use  Smoking Status Never  Smokeless Tobacco Never   Counseling given: Not Answered   Outpatient Medications Prior to Visit  Medication Sig Dispense Refill   amphetamine-dextroamphetamine (ADDERALL XR) 15 MG 24 hr capsule Take by mouth daily.     amphetamine-dextroamphetamine (ADDERALL XR) 20 MG 24 hr capsule Take 20 mg by mouth every morning.     buPROPion (WELLBUTRIN XL) 150 MG 24 hr tablet Take 1 tablet (150 mg total) by mouth daily. 90 tablet 3   No facility-administered medications  prior to visit.     Review of Systems:   Constitutional:   No  weight loss, night sweats,  Fevers, chills, fatigue, or  lassitude.  HEENT:   No headaches,  Difficulty swallowing,  Tooth/dental problems, or  Sore throat,                No sneezing, itching, ear ache, nasal congestion, post nasal drip,   CV:  No chest pain,  Orthopnea, PND, swelling in lower extremities, anasarca, dizziness, palpitations, syncope.   GI  No heartburn, indigestion, abdominal pain, nausea, vomiting, diarrhea, change in bowel habits, loss of appetite, bloody stools.   Resp: No shortness of breath with exertion or at rest.  No excess mucus, no productive cough,  No non-productive cough,  No coughing up of blood.  No change in color of mucus.  No wheezing.  No chest wall deformity  Skin: no rash or lesions.  GU: no dysuria, change in color of urine, no urgency or frequency.  No flank pain, no hematuria   MS:  No joint pain or swelling.  No decreased range of motion.  No back pain.    Physical Exam  BP 130/86 (BP Location: Left Arm, Patient Position: Sitting, Cuff Size: Large)   Pulse 64   Ht 6' (1.829 m)   Wt 247 lb (112 kg)   SpO2 100%   BMI 33.50 kg/m   GEN: A/Ox3; pleasant , NAD, well nourished  HEENT:  /AT,NOSE-clear, THROAT-clear, no lesions, no postnasal drip or exudate noted.   NECK:  Supple w/ fair ROM; no JVD; normal carotid impulses w/o bruits; no thyromegaly or nodules palpated; no lymphadenopathy.    RESP  Clear  P & A; w/o, wheezes/ rales/ or rhonchi. no accessory muscle use, no dullness to percussion  CARD:  RRR, no m/r/g, no peripheral edema, pulses intact, no cyanosis or clubbing.  GI:   Soft & nt; nml bowel sounds; no organomegaly or masses detected.   Musco: Warm bil, no deformities or joint swelling noted.   Neuro: alert, no focal deficits noted.    Skin: Warm, no lesions or rashes    Lab Results:  CBC    Component Value Date/Time   WBC 7.9 10/21/2019 1638    RBC 4.89 10/21/2019 1638   HGB 14.6 10/21/2019 1638   HCT 42.4 10/21/2019 1638   MCV 87 10/21/2019 1638   MCH 29.9 10/21/2019 1638   MCHC 34.4 10/21/2019 1638   RDW 13.0 10/21/2019 1638   LYMPHSABS 2.3 10/21/2019 1638   EOSABS 0.1 10/21/2019 1638   BASOSABS 0.0 10/21/2019 1638    BMET    Component Value Date/Time   NA 140 01/11/2022 0811   NA 143 10/21/2019 1638   K 4.2 01/11/2022 0811   CL 105 01/11/2022 0811   CO2 28 01/11/2022 0811   GLUCOSE 85 01/11/2022 0811   BUN 14 01/11/2022 0811   BUN 10 10/21/2019 1638   CREATININE 1.44 01/11/2022 0811   CALCIUM 8.9 01/11/2022 0811   GFRNONAA 80 10/21/2019 1638   GFRAA 93 10/21/2019 1638    BNP No results found for: "BNP"  ProBNP No results found for: "PROBNP"  Imaging: No results found.  Administration History     None           No data to display          No results found for: "NITRICOXIDE"      Assessment & Plan:   No problem-specific Assessment & Plan notes found for this encounter.     Rubye Oaks, NP 12/24/2022

## 2022-12-27 NOTE — Assessment & Plan Note (Signed)
Mild obstructive sleep apnea.  Patient has perceived benefit on CPAP.  Continue on CPAP encouraged on increased daily usage as able with his current work schedule.  Will change his CPAP pressure for comfort.  Changed to auto CPAP 8 to 15 cm H2O.  Plan  Patient Instructions  Continue on CPAP At bedtime, wear all night long for at least 6hr or more .  May like Dreamwear nasal pillows.  Change CPAP pressure 8 to 15cmH2O Healthy weight loss.  Do not drive if sleepy  Follow up in 4-6 months with Dr. Maple Hudson or Coumba Kellison NP and As needed

## 2023-04-08 ENCOUNTER — Ambulatory Visit: Payer: 59 | Admitting: Internal Medicine

## 2023-04-09 ENCOUNTER — Encounter: Payer: Self-pay | Admitting: Internal Medicine

## 2023-04-09 ENCOUNTER — Ambulatory Visit: Payer: 59 | Admitting: Internal Medicine

## 2023-04-09 VITALS — BP 124/85 | HR 64 | Temp 97.6°F | Ht 72.0 in | Wt 254.6 lb

## 2023-04-09 DIAGNOSIS — E782 Mixed hyperlipidemia: Secondary | ICD-10-CM

## 2023-04-09 DIAGNOSIS — E785 Hyperlipidemia, unspecified: Secondary | ICD-10-CM | POA: Diagnosis not present

## 2023-04-09 DIAGNOSIS — R072 Precordial pain: Secondary | ICD-10-CM

## 2023-04-09 DIAGNOSIS — R7303 Prediabetes: Secondary | ICD-10-CM | POA: Diagnosis not present

## 2023-04-09 LAB — CBC WITH DIFFERENTIAL/PLATELET
Basophils Absolute: 0 10*3/uL (ref 0.0–0.1)
Basophils Relative: 0.4 % (ref 0.0–3.0)
Eosinophils Absolute: 0.1 10*3/uL (ref 0.0–0.7)
Eosinophils Relative: 1.4 % (ref 0.0–5.0)
HCT: 42.3 % (ref 39.0–52.0)
Hemoglobin: 14.2 g/dL (ref 13.0–17.0)
Lymphocytes Relative: 38.2 % (ref 12.0–46.0)
Lymphs Abs: 1.7 10*3/uL (ref 0.7–4.0)
MCHC: 33.6 g/dL (ref 30.0–36.0)
MCV: 89.6 fL (ref 78.0–100.0)
Monocytes Absolute: 0.4 10*3/uL (ref 0.1–1.0)
Monocytes Relative: 8.9 % (ref 3.0–12.0)
Neutro Abs: 2.3 10*3/uL (ref 1.4–7.7)
Neutrophils Relative %: 51.1 % (ref 43.0–77.0)
Platelets: 155 10*3/uL (ref 150.0–400.0)
RBC: 4.72 Mil/uL (ref 4.22–5.81)
RDW: 12.8 % (ref 11.5–15.5)
WBC: 4.5 10*3/uL (ref 4.0–10.5)

## 2023-04-09 LAB — COMPREHENSIVE METABOLIC PANEL
ALT: 32 U/L (ref 0–53)
AST: 29 U/L (ref 0–37)
Albumin: 4.3 g/dL (ref 3.5–5.2)
Alkaline Phosphatase: 39 U/L (ref 39–117)
BUN: 17 mg/dL (ref 6–23)
CO2: 28 meq/L (ref 19–32)
Calcium: 9.2 mg/dL (ref 8.4–10.5)
Chloride: 104 meq/L (ref 96–112)
Creatinine, Ser: 1.31 mg/dL (ref 0.40–1.50)
GFR: 69.49 mL/min (ref >60.00)
Glucose, Bld: 75 mg/dL (ref 70–99)
Potassium: 4.1 meq/L (ref 3.5–5.1)
Sodium: 138 meq/L (ref 135–145)
Total Bilirubin: 0.5 mg/dL (ref 0.2–1.2)
Total Protein: 7.3 g/dL (ref 6.0–8.3)

## 2023-04-09 LAB — LIPID PANEL
Cholesterol: 222 mg/dL — ABNORMAL HIGH (ref 0–200)
HDL: 53.4 mg/dL (ref >39.00)
LDL Cholesterol: 154 mg/dL — ABNORMAL HIGH (ref 0–99)
NonHDL: 168.12
Total CHOL/HDL Ratio: 4
Triglycerides: 70 mg/dL (ref 0.0–149.0)
VLDL: 14 mg/dL (ref 0.0–40.0)

## 2023-04-09 LAB — MICROALBUMIN / CREATININE URINE RATIO
Creatinine,U: 228.7 mg/dL
Microalb Creat Ratio: 0.3 mg/g (ref 0.0–30.0)
Microalb, Ur: <0.7 mg/dL (ref 0.0–1.9)

## 2023-04-09 LAB — HEMOGLOBIN A1C: Hgb A1c MFr Bld: 5.8 % (ref 4.6–6.5)

## 2023-04-09 MED ORDER — ROSUVASTATIN CALCIUM 40 MG PO TABS: 40.0000 mg | ORAL_TABLET | Freq: Every day | ORAL | 3 refills | Status: AC

## 2023-04-09 MED ORDER — ASPIRIN 81 MG PO TBEC: 81.0000 mg | DELAYED_RELEASE_TABLET | Freq: Every day | ORAL | Status: AC

## 2023-04-09 NOTE — Patient Instructions (Signed)
After Visit Summary Instructions  Thank you for coming in today. Here is a summary of your visit and instructions:  New Medications: 1. Rosuvastatin 40mg  - Take 1 tablet by mouth daily    - This medication helps lower cholesterol    - May cause muscle aches initially    - Use GoodRx coupon if insurance coverage issues 2. Aspirin 81mg  - Take 1 tablet by mouth daily    - This helps prevent blood clots  Tests/Procedures Ordered: 1. Lab work today:    - Cholesterol panel    - Blood sugar (A1c)    - Complete blood count    - Comprehensive metabolic panel 2. CT scan of heart (Calcium Score)    - Office will call to schedule    - $100 self-pay cost (insurance typically doesn't cover) 3. Cardiology consultation    - Office will contact you to schedule  Activity Instructions: - Continue your current exercise routine (3-4 times weekly) - Avoid extremely strenuous workouts until cardiac evaluation complete - Stop exercise and seek care if chest pain becomes severe  Lifestyle Changes: - Discontinue smoking cigars - Continue healthy diet improvements - Maintain regular exercise schedule  Warning Signs - Go to Emergency Room if: - Chest pain becomes severe - Pain spreads to arm, jaw, or back - Shortness of breath develops - Sweating with chest pain - Dizziness or passing out  Follow-up: - Complete CT scan when scheduled (within next few days) - Attend cardiology appointment when scheduled - Follow up after tests are completed to review results  Call office if: - Unable to tolerate new medications - Questions about test scheduling - Worsening symptoms (non-emergency)  It was a pleasure seeing you today! Your health and satisfaction are our top priorities.  Glenetta Hew, MD  Your Providers PCP: Lula Olszewski, MD,  [phone]) Referring Provider: Lula Olszewski, MD,  [phone]) Care Team Provider: Julio Sicks, NP,  [phone])     NEXT STEPS: [x]   Early Intervention: Schedule sooner appointment, call our on-call services, or go to emergency room if there is any significant Increase in pain or discomfort New or worsening symptoms Sudden or severe changes in your health [x]  Flexible Follow-Up: We recommend a No follow-ups on file. for optimal routine care. This allows for progress monitoring and treatment adjustments. [x]  Preventive Care: Schedule your annual preventive care visit! It's typically covered by insurance and helps identify potential health issues early. [x]  Lab & X-ray Appointments: Incomplete tests scheduled today, or call to schedule. X-rays: [facility] (M-F, 8:30am-noon or 1pm-5pm). [x]  Medical Information Release: Sign a release form at front desk to obtain relevant medical information we don't have.  MAKING THE MOST OF OUR FOCUSED 20 MINUTE APPOINTMENTS: [x]   Clearly state your top concerns at the beginning of the visit to focus our discussion [x]   If you anticipate you will need more time, please inform the front desk during scheduling - we can book multiple appointments in the same week. [x]   If you have transportation problems- use our convenient video appointments or ask about transportation support. [x]   We can get down to business faster if you use MyChart to update information before the visit and submit non-urgent questions before your visit. Thank you for taking the time to provide details through MyChart.  Let our nurse know and she can import this information into your encounter documents.  Arrival and Wait Times: [x]   Arriving on time ensures that everyone receives  prompt attention. [x]   Early morning (8a) and afternoon (1p) appointments tend to have shortest wait times. [x]   Unfortunately, we cannot delay appointments for late arrivals or hold slots during phone calls.  Getting Answers and Following Up [x]   Simple Questions & Concerns: For quick questions or basic follow-up after your visit,  reach [place] at [phone] or MyChart messaging. [x]   Complex Concerns: If your concern is more complex, scheduling an appointment might be best. Discuss this with the staff to find the most suitable option. [x]   Lab & Imaging Results: We'll contact you directly if results are abnormal or you don't use MyChart. Most normal results will be on MyChart within 2-3 business days, with a review message from Dr. Jon Billings. Haven't heard back in 2 weeks? Need results sooner? Contact us at [phone]. [x]   Referrals: Our referral coordinator will manage specialist referrals. The specialist's office should contact you within 2 weeks to schedule an appointment. Call us if you haven't heard from them after 2 weeks.  Staying Connected [x]   MyChart: Activate your MyChart for the fastest way to access results and message [place]. See the last page of this paperwork for instructions on how to activate.  Bring to Your Next Appointment [x]   Medications: Please bring all your medication bottles to your next appointment to ensure we have an accurate record of your prescriptions. [x]   Health Diaries: If you're monitoring any health conditions at home, keeping a diary of your readings can be very helpful for discussions at your next appointment.  Billing [x]   X-ray & Lab Orders: These are billed by separate companies. Contact the invoicing company directly for questions or concerns. [x]   Visit Charges: Discuss any billing inquiries with our administrative services team.  Your Satisfaction Matters [x]   Share Your Experience: We strive for your satisfaction! If you have any complaints, or preferably compliments, please let Dr. Jon Billings know directly or contact our Practice Administrators, Edwena Felty or [employer], by asking at the front desk.   Reviewing Your Records [x]   Review this early draft of your clinical encounter notes below and the final encounter summary tomorrow on MyChart after its been completed.  All  orders placed so far are visible here: Precordial pain -     Ambulatory referral to Cardiology -     Aspirin; Take 1 tablet (81 mg total) by mouth daily. Swallow whole. -     CT CARDIAC SCORING ([facility] LOCATIONS ONLY); Future  Hyperlipidemia, unspecified hyperlipidemia type -     Rosuvastatin Calcium; Take 1 tablet (40 mg total) by mouth daily.  Dispense: 90 tablet; Refill: 3 -     CT CARDIAC SCORING ([facility] LOCATIONS ONLY); Future  Mixed hyperlipidemia -     Ambulatory referral to Cardiology -     Rosuvastatin Calcium; Take 1 tablet (40 mg total) by mouth daily.  Dispense: 90 tablet; Refill: 3 -     CBC with Differential/Platelet -     Comprehensive metabolic panel -     Lipid panel -     Hemoglobin A1c -     Microalbumin / creatinine urine ratio -     CT CARDIAC SCORING ([facility] LOCATIONS ONLY); Future  Prediabetes -     Ambulatory referral to Cardiology -     Rosuvastatin Calcium; Take 1 tablet (40 mg total) by mouth daily.  Dispense: 90 tablet; Refill: 3 -     CBC with Differential/Platelet -     Comprehensive metabolic panel -  Lipid panel -     Hemoglobin A1c -     Microalbumin / creatinine urine ratio -     CT CARDIAC SCORING ([facility] LOCATIONS ONLY); Future

## 2023-04-09 NOTE — Assessment & Plan Note (Signed)
Follow-up:  Complete cardiac CT calcium score within next few days Attend cardiology consultation when scheduled Start medications as prescribed Lab review and follow-up as needed based on results Seek immediate emergency evaluation if chest pain worsens or new symptoms develop

## 2023-04-09 NOTE — Assessment & Plan Note (Signed)
Prediabetes (ICD-10: R73.03) Assessment:  Previous A1c 5.7% (12/2021) Making good lifestyle modifications with improved diet and regular exercise  Plan: Continue lifestyle modifications Discussed connection between blood sugar control and cardiovascular health Monitor for progression

## 2023-04-09 NOTE — Progress Notes (Signed)
[facility]: [phone]   -- Medical Office Visit --  Patient:  Joshua Perkins      Age: 37 y.o.       Sex:  male  Date:   04/09/2023 Today's Healthcare Provider: Lula Olszewski, MD  ==========================================================================     Assessment & Plan Precordial pain Precordial Pain (ICD-10: R07.2) Assessment:  37 year old male with 2-year history of intermittent left-sided chest pain, more frequent over past 2 weeks Pain is sharp, non-radiating, not exacerbated by exertion No associated symptoms suggesting acute coronary syndrome Recent initiation of cigar smoking 1.5 months ago Cardiovascular risk factors include hyperlipidemia and prediabetes No family history of premature coronary disease Normal cardiac exam without murmurs Pain improving with exercise program suggests musculoskeletal etiology, though cardiac cause cannot be excluded  Plan:  Cardiology referral for comprehensive cardiac evaluation CT coronary calcium score ($100 self-pay) to be completed within next few days Start rosuvastatin 40mg  daily for aggressive lipid management Start aspirin 81mg  daily Acetaminophen or ibuprofen as needed for pain Strong recommendation to discontinue cigar smoking Continue current exercise program but avoid extreme exertion until cardiac evaluation complete Emergency department evaluation if pain becomes severe or associated with shortness of breath/other concerning symptoms Hyperlipidemia, unspecified hyperlipidemia type Mixed Hyperlipidemia (ICD-10: E78.2) Assessment:  Elevated total cholesterol (233) and LDL (162) Not currently on lipid-lowering therapy Increased cardiovascular risk given current chest pain symptoms  Plan:  Start rosuvastatin 40mg  daily Baseline labs ordered including lipid panel, CMP, CBC Continue dietary improvements and regular exercise Monitor for muscle aches as potential statin  side effect Recheck lipids in 3 months Mixed hyperlipidemia Follow-up:  Complete cardiac CT calcium score within next few days Attend cardiology consultation when scheduled Start medications as prescribed Lab review and follow-up as needed based on results Seek immediate emergency evaluation if chest pain worsens or new symptoms develop Prediabetes Prediabetes (ICD-10: R73.03) Assessment:  Previous A1c 5.7% (12/2021) Making good lifestyle modifications with improved diet and regular exercise  Plan: Continue lifestyle modifications Discussed connection between blood sugar control and cardiovascular health Monitor for progression     Orders Placed During this Encounter:  Diagnoses and all orders for this visit: Hyperlipidemia, unspecified hyperlipidemia type -     rosuvastatin (CRESTOR) 40 MG tablet; Take 1 tablet (40 mg total) by mouth daily. -     CT CARDIAC SCORING (SELF PAY ONLY); Future Precordial pain -     Ambulatory referral to Cardiology -     CT CARDIAC SCORING (SELF PAY ONLY); Future -     aspirin EC 81 MG tablet; Take 1 tablet (81 mg total) by mouth daily. Swallow whole. Mixed hyperlipidemia -     Ambulatory referral to Cardiology -     rosuvastatin (CRESTOR) 40 MG tablet; Take 1 tablet (40 mg total) by mouth daily. -     CBC with Differential/Platelet -     Comprehensive metabolic panel -     Lipid panel -     Hemoglobin A1c -     Microalbumin / creatinine urine ratio -     CT CARDIAC SCORING (SELF PAY ONLY); Future Prediabetes -     Ambulatory referral to Cardiology -     rosuvastatin (CRESTOR) 40 MG tablet; Take 1 tablet (40 mg total) by mouth daily. -     CBC with Differential/Platelet -     Comprehensive metabolic panel -     Lipid panel -     Hemoglobin A1c -  Microalbumin / creatinine urine ratio -     CT CARDIAC SCORING (SELF PAY ONLY); Future  Follow-up: Complete cardiac CT calcium score within next few days Attend cardiology consultation when  scheduled Start medications as prescribed Lab review and follow-up as needed based on results Seek immediate emergency evaluation if chest pain worsens or new symptoms develop   SUBJECTIVE: 37 y.o. male who has Snoring; Circadian rhythm sleep disorder; Mixed hyperlipidemia; Concentration deficit; Prediabetes; OSA (obstructive sleep apnea); and RLS (restless legs syndrome) on their problem list.  Main reasons for visit/main concerns/chief complaint: Intermittent chest pain (Closer to left side of chest near heart, for about the last two years and has been more persistent recently. Not currently.)    AI-Extracted: Discussed the use of AI scribe software for clinical note transcription with the patient, who gave verbal consent to proceed.  History of Present Illness:  The patient presents with intermittent sharp chest pain located at the lower left sternal border that has been present for approximately 2 years. Over the past 2 weeks, the pain has become more frequent, occurring most days. The pain maintains a constant intensity without identified aggravating or alleviating factors. Notably, the pain is not exacerbated by physical activity - the patient exercises 3-4 times weekly for an hour each session and reports no chest pain during workouts. In fact, the patient notes the pain frequency has decreased since starting his exercise regimen.  The patient denies radiation of pain to arm, jaw, or back. There are no associated symptoms such as shortness of breath, nausea, vomiting, diaphoresis, palpitations, or dizziness. The patient reports no change in exercise tolerance and can complete his hour-long workouts without limitation. 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  Of note, the patient started smoking cigars approximately 1.5 months ago, which predates this recent flare of chest pain. He reports a history of GERD symptoms between ages [age] but  denies recent heartburn, particularly since starting his exercise program. His nutrition and diet have been improving recently. He denies recent respiratory infections, medication changes, or increased stress/anxiety. He reports sleeping well despite working third shift.  The patient has no family history of early coronary artery disease or cardiac events. His father has diabetes and hypertension. The patient has known hyperlipidemia and borderline prediabetes (A1c 5.7%) but has been making lifestyle modifications with improved diet and regular exercise.   Note that patient  has no past medical history on file.  Problem list overviews that were updated at today's visit:No problems updated.  Med reconciliation: Current Outpatient Medications on File Prior to Visit  Medication Sig   amphetamine-dextroamphetamine (ADDERALL XR) 15 MG 24 hr capsule Take by mouth daily.   amphetamine-dextroamphetamine (ADDERALL XR) 20 MG 24 hr capsule Take 20 mg by mouth every morning.   buPROPion (WELLBUTRIN XL) 150 MG 24 hr tablet Take 1 tablet (150 mg total) by mouth daily.   No current facility-administered medications on file prior to visit.  There are no discontinued medications.   Objective   Physical Exam     04/09/2023    9:00 AM 12/24/2022    9:53 AM 06/08/2022    3:33 PM  Vitals with BMI  Height 6\' 0"  6\' 0"  6\' 0"   Weight 254 lbs 10 oz 247 lbs 252 lbs  BMI 34.52 33.49 34.17  Systolic 124 130 161  Diastolic 85 86 80  Pulse 64 64 96   Wt Readings from Last 10 Encounters:  04/09/23 254  lb 9.6 oz (115.5 kg)  12/24/22 247 lb (112 kg)  06/08/22 252 lb (114.3 kg)  04/05/22 251 lb 9.6 oz (114.1 kg)  02/12/22 260 lb 3.2 oz (118 kg)  03/18/20 262 lb (118.8 kg)  03/03/20 263 lb 12.8 oz (119.7 kg)  10/21/19 278 lb (126.1 kg)  06/14/14 240 lb (108.9 kg)   Vital signs reviewed.  Nursing notes reviewed. Weight trend reviewed. Abnormalities and Problem-Specific physical exam findings:  muscular.   General Appearance:  No acute distress appreciable.   Well-groomed, healthy-appearing male.  Well proportioned with no abnormal fat distribution.  Good muscle tone. Pulmonary:  Normal work of breathing at rest, no respiratory distress apparent. SpO2: 99 %  Musculoskeletal: All extremities are intact.  Neurological:  Awake, alert, oriented, and engaged.  No obvious focal neurological deficits or cognitive impairments.  Sensorium seems unclouded.   Speech is clear and coherent with logical content. Psychiatric:  Appropriate mood, pleasant and cooperative demeanor, thoughtful and engaged during the exam  Results            No results found for any visits on 04/09/23.  No visits with results within 1 Year(s) from this visit.  Latest known visit with results is:  Lab on 01/11/2022  Component Date Value   Sodium 01/11/2022 140    Potassium 01/11/2022 4.2    Chloride 01/11/2022 105    CO2 01/11/2022 28    Glucose, Bld 01/11/2022 85    BUN 01/11/2022 14    Creatinine, Ser 01/11/2022 1.44    Total Bilirubin 01/11/2022 0.4    Alkaline Phosphatase 01/11/2022 47    AST 01/11/2022 24    ALT 01/11/2022 25    Total Protein 01/11/2022 6.9    Albumin 01/11/2022 4.1    GFR 01/11/2022 62.57    Calcium 01/11/2022 8.9    Cholesterol 01/11/2022 233 (H)    Triglycerides 01/11/2022 105.0    HDL 01/11/2022 50.40    VLDL 01/11/2022 21.0    LDL Cholesterol 01/11/2022 162 (H)    Total CHOL/HDL Ratio 01/11/2022 5    NonHDL 01/11/2022 183.00    Hgb A1c MFr Bld 01/11/2022 5.7    No image results found.   No results found.  DG Chest 1 View  Result Date: 11/20/2020 CLINICAL DATA:  PRE EMPLOYMENT, cough intermittent for a couple of months EXAM: CHEST  1 VIEW COMPARISON:  Radiograph 10/27/2007 FINDINGS: The heart size and mediastinal contours are within normal limits.No focal airspace disease. No pleural effusion or pneumothorax.No acute osseous abnormality. IMPRESSION: No evidence of acute  cardiopulmonary disease. Electronically Signed   By: Caprice Renshaw   On: 11/20/2020 10:12         Additional Info: This encounter employed real-time, collaborative documentation. The patient actively reviewed and updated their medical record on a shared screen, ensuring transparency and facilitating joint problem-solving for the problem list, overview, and plan. This approach promotes accurate, informed care. The treatment plan was discussed and reviewed in detail, including medication safety, potential side effects, and all patient questions. We confirmed understanding and comfort with the plan. Follow-up instructions were established, including contacting the office for any concerns, returning if symptoms worsen, persist, or new symptoms develop, and precautions for potential emergency department visits.

## 2023-04-14 NOTE — Progress Notes (Signed)
=====================================   LABORATORY RESULTS REVIEW - 04/09/23 =====================================  SUMMARY ------- Most results are normal and healthy, with cholesterol levels requiring attention.  CHOLESTEROL PANEL RESULTS ------------------------ HDL Cholesterol (Good Cholesterol) Current: 53.40 mg/dL    Previous: 84.69 (10/27/50)    Trend: Improving Reference: >39 mg/dL Note: While in normal range, optimal longevity associated with levels >100  LDL Cholesterol (Bad Cholesterol) Current: 154 mg/dL      Previous: 841 (07/22/38)      Trend: Improving Reference: 0-99 mg/dL Status: Above desired range  Triglycerides Current: 70.0 mg/dL     Previous: 102.7 (2/53/66)    Trend: Excellent improvement Reference: 0-149 mg/dL Status: Healthy range  Total Cholesterol Current: 222 mg/dL      Previous: 440 (3/47/42)      Trend: Improving Reference: 0-200 mg/dL Status: Slightly elevated  RECOMMENDED ACTION PLAN ---------------------- 1. DIETARY MODIFICATIONS     REDUCE INTAKE OF:    * Saturated fats    * High cholesterol foods    * Refined carbohydrates    * Added sugars    * Processed starches    * Refined grains     INCREASE INTAKE OF:    * Extra Virgin Olive Oil    * Fatty fish (salmon, mackerel, sardines)    * Nuts and seeds    * Avocados    * Omega-3 supplements (considered safe)  2. LIFESTYLE RECOMMENDATIONS    * Maintain consistent physical activity    * Focus on weight management    * Monitor food intake  FOLLOW-UP PLAN ------------- 1. Continue recommended lifestyle modifications 2. Schedule cholesterol recheck: 6-12 months 3. Optional follow-up appointment available to discuss:    - Detailed results review    - Personalized treatment planning    - Advanced cardiac risk evaluation    - Medication options if appropriate  PATIENT RESOURCES ---------------- Heart-healthy living information: American Heart  Association [email or web address]  =====================================  OTHER LABORATORY RESULTS ----------------------- Blood Sugar (Glucose): 75 mg/dL (Ref: 59-56)        Status: Normal Hemoglobin A1C: 5.8% (Ref: 4.6-6.5%)               Status: Normal Kidney Function (GFR): 69.49 mL/min (Ref: >60)      Status: Normal Liver Function Tests: All within normal ranges  =====================================  NOTES ----- - HDL continues to improve - Triglycerides show significant improvement - LDL remains elevated but improving - Overall cardiovascular risk can be further reduced through lifestyle modifications  =====================================  Please contact office with questions prior to next appointment. Marland Kitchen

## 2023-04-15 ENCOUNTER — Ambulatory Visit
Admission: RE | Admit: 2023-04-15 | Discharge: 2023-04-15 | Disposition: A | Discharge status: Home / Self Care | Payer: No Typology Code available for payment source | Source: Ambulatory Visit | Attending: Internal Medicine | Admitting: Internal Medicine

## 2023-04-15 DIAGNOSIS — E785 Hyperlipidemia, unspecified: Secondary | ICD-10-CM

## 2023-04-15 DIAGNOSIS — R7303 Prediabetes: Secondary | ICD-10-CM

## 2023-04-15 DIAGNOSIS — R072 Precordial pain: Secondary | ICD-10-CM

## 2023-04-15 DIAGNOSIS — E782 Mixed hyperlipidemia: Secondary | ICD-10-CM

## 2023-05-14 ENCOUNTER — Ambulatory Visit: Payer: Self-pay | Admitting: Internal Medicine

## 2023-05-14 ENCOUNTER — Telehealth: Payer: Self-pay | Admitting: *Deleted

## 2023-05-14 NOTE — Telephone Encounter (Signed)
 Copied from CRM [id number]. Topic: Clinical - Lab/Test Results >> May 14, 2023 11:05 AM Benton KIDD wrote: Reason for CRM: patient is calling because he missed his appointment  because of a family emergency . Patient say appointment was for the results of a ct scan that was supposed to be sent to his doctor . Patient is wanting to know can the doctor send those results through mychart if possible . Call back number [phone].

## 2023-05-21 NOTE — Telephone Encounter (Signed)
Called the [facility] number to have the resulting of his imaging escalated. Called patient and informed him of this information and that Dr. Jon Billings does not see anything worrisome.

## 2023-05-22 ENCOUNTER — Telehealth: Payer: Self-pay | Admitting: Adult Health

## 2023-05-22 ENCOUNTER — Ambulatory Visit: Payer: 59 | Admitting: Adult Health

## 2023-05-22 DIAGNOSIS — G4733 Obstructive sleep apnea (adult) (pediatric): Secondary | ICD-10-CM

## 2023-05-22 NOTE — Telephone Encounter (Signed)
Order placed

## 2023-05-22 NOTE — Telephone Encounter (Signed)
Patient needs a new mask for his CPAP Machine. [facility]. Please call and advise [phone]

## 2023-05-22 NOTE — Telephone Encounter (Signed)
It looks like [facility] is work on this order.

## 2023-05-23 NOTE — Telephone Encounter (Signed)
Of course that is fine for mask order . Set up follow up when it works for him

## 2023-05-31 NOTE — Progress Notes (Deleted)
 Cardiology Office Note:    Date:  05/31/2023   ID:  Joshua Perkins, DOB October 26, 1985, MRN [id number]  PCP:  Jesus Bernardino MATSU, MD   [facility] Providers Cardiologist:  None { Click to update primary MD,subspecialty MD or APP then REFRESH:1}    Referring MD: Jesus Bernardino MATSU, MD   No chief complaint on file. ***  History of Present Illness:    Joshua Perkins is a 38 y.o. male is seen at the request of Dr Jesus for evaluation of chest pain. He has a history of HLD and prediabetes. Recent coronary calcium  score was 0.   No past medical history on file.  No past surgical history on file.  Current Medications: No outpatient medications have been marked as taking for the 06/04/23 encounter (Appointment) with Zelene Barga M, MD.     Allergies:   Patient has no known allergies.   Social History   Socioeconomic History   Marital status: Married    Spouse name: Not on file   Number of children: Not on file   Years of education: Not on file   Highest education level: Not on file  Occupational History   Not on file  Tobacco Use   Smoking status: Never   Smokeless tobacco: Never  Substance and Sexual Activity   Alcohol use: Yes   Drug use: No   Sexual activity: Yes  Other Topics Concern   Not on file  Social History Narrative   Not on file   Social Drivers of Health   Financial Resource Strain: Not on file  Food Insecurity: Not on file  Transportation Needs: Not on file  Physical Activity: Not on file  Stress: Not on file  Social Connections: Not on file     Family History: The patient's ***family history includes Diabetes in his father; Hypertension in his mother.  ROS:   Please see the history of present illness.    *** All other systems reviewed and are negative.  EKGs/Labs/Other Studies Reviewed:    The following studies were reviewed today: CT CARDIAC CORONARY ARTERY CALCIUM  SCORE   TECHNIQUE: Non-contrast imaging through the heart was  performed using prospective ECG gating. Image post processing was performed on an independent workstation, allowing for quantitative analysis of the heart and coronary arteries. Note that this exam targets the heart and the chest was not imaged in its entirety.   COMPARISON:  None available.   FINDINGS: CORONARY CALCIUM  SCORES:   Left Main: 0   LAD: 0   LCx: 0   RCA/PDA: 0   Total Agatston Score: 0   MESA database percentile: The MESA database begins at age 8.   AORTA MEASUREMENTS:   Ascending Aorta: 2.9 cm   Descending Aorta:2.6 cm   OTHER FINDINGS:   The heart size is within normal limits. No pericardial fluid is identified. Visualized segments of the thoracic aorta and central pulmonary arteries are normal in caliber. Visualized mediastinum and hilar regions demonstrate no lymphadenopathy or masses. Visualized lungs show no evidence of pulmonary edema, consolidation, pneumothorax, nodule or pleural fluid. Visualized upper abdomen and bony structures are unremarkable.   IMPRESSION: Coronary calcium  score of 0.     Electronically Signed   By: Marcey Moan M.D.   On: 05/21/2023 15:04        Recent Labs: 04/09/2023: ALT 32; BUN 17; Creatinine, Ser 1.31; Hemoglobin 14.2; Platelets 155.0; Potassium 4.1; Sodium 138  Recent Lipid Panel    Component Value Date/Time   CHOL  222 (H) 04/09/2023 0950   CHOL 248 (H) 03/18/2020 1010   TRIG 70.0 04/09/2023 0950   HDL 53.40 04/09/2023 0950   HDL 47 03/18/2020 1010   CHOLHDL 4 04/09/2023 0950   VLDL 14.0 04/09/2023 0950   LDLCALC 154 (H) 04/09/2023 0950   LDLCALC 179 (H) 03/18/2020 1010     Risk Assessment/Calculations:   {Does this patient have ATRIAL FIBRILLATION?[phone]}  No BP recorded.  {Refresh Note OR Click here to enter BP  :1}***         Physical Exam:    VS:  There were no vitals taken for this visit.    Wt Readings from Last 3 Encounters:  04/09/23 254 lb 9.6 oz (115.5 kg)  12/24/22  247 lb (112 kg)  06/08/22 252 lb (114.3 kg)     GEN: *** Well nourished, well developed in no acute distress HEENT: Normal NECK: No JVD; No carotid bruits LYMPHATICS: No lymphadenopathy CARDIAC: ***RRR, no murmurs, rubs, gallops RESPIRATORY:  Clear to auscultation without rales, wheezing or rhonchi  ABDOMEN: Soft, non-tender, non-distended MUSCULOSKELETAL:  No edema; No deformity  SKIN: Warm and dry NEUROLOGIC:  Alert and oriented x 3 PSYCHIATRIC:  Normal affect   ASSESSMENT:    No diagnosis found. PLAN:    In order of problems listed above:  ***      {Are you ordering a CV Procedure (e.g. stress test, cath, DCCV, TEE, etc)?   Press F2        :[id number]}    Medication Adjustments/Labs and Tests Ordered: Current medicines are reviewed at length with the patient today.  Concerns regarding medicines are outlined above.  No orders of the defined types were placed in this encounter.  No orders of the defined types were placed in this encounter.   There are no Patient Instructions on file for this visit.   Signed, Bonnie Overdorf, MD  05/31/2023 7:56 AM    [facility]

## 2023-06-04 ENCOUNTER — Ambulatory Visit: Payer: Self-pay | Admitting: Cardiology

## 2023-06-26 ENCOUNTER — Ambulatory Visit: Payer: 59 | Admitting: Adult Health

## 2023-07-11 ENCOUNTER — Encounter: Payer: Self-pay | Admitting: Adult Health

## 2023-07-11 ENCOUNTER — Ambulatory Visit: Payer: Self-pay | Admitting: Adult Health

## 2024-02-03 ENCOUNTER — Ambulatory Visit: Admitting: Internal Medicine

## 2024-02-03 ENCOUNTER — Encounter: Payer: Self-pay | Admitting: Internal Medicine

## 2024-02-03 VITALS — BP 138/88 | HR 66 | Temp 98.2°F | Ht 72.0 in | Wt 261.0 lb

## 2024-02-03 DIAGNOSIS — K5900 Constipation, unspecified: Secondary | ICD-10-CM | POA: Insufficient documentation

## 2024-02-03 DIAGNOSIS — K648 Other hemorrhoids: Secondary | ICD-10-CM | POA: Diagnosis not present

## 2024-02-03 NOTE — Patient Instructions (Addendum)
 It was a pleasure seeing you today! Your health and satisfaction are our top priorities.  Bernardino Cone, MD  VISIT SUMMARY: During your visit, we discussed your concerns about hemorrhoids and the potential risk of colon cancer. You have experienced hemorrhoids since high school, with the most recent episode occurring two to three weeks ago. The swelling has decreased significantly after using hemorrhoid cream, and there is no current pain, although you can still feel a small residual swelling. You are concerned about the potential link between hemorrhoids and colon cancer, especially given your age and the absence of a family history of colon cancer.  YOUR PLAN: -HEMORRHOIDS WITH SUSPECTED DEFECATORY DISORDER AND CHRONIC CONSTIPATION: Hemorrhoids are swollen veins in the lower rectum and anus, often caused by straining during bowel movements. Your recurrent hemorrhoids have resolved with topical treatment, but a small residual hemorrhoid remains. We suspect a defecatory disorder due to straining during bowel movements, possibly related to chronic constipation. To address this, we recommend increasing your dietary fiber and fluid intake to reduce straining. Using a footstool or squatty potty to elevate your knees during defecation is also advised. We will discuss potential treatments for defecatory disorder, such as rectal dilation, and provide guidance on using witch hazel and other supportive measures for hemorrhoid management.  -SCREENING FOR COLORECTAL CANCER: Colorectal cancer is a type of cancer that starts in the colon or rectum. While there is no direct correlation between hemorrhoids and colorectal cancer, shared risk factors like chronic constipation are acknowledged. Given your concerns and risk factors, we will refer you to gastroenterology for a colonoscopy and further evaluation. We will draft a letter of support to advocate for insurance coverage of the colonoscopy and provide documentation and  guidance on discussing your symptoms with the gastroenterologist. Tracking your symptoms and bowel habits will be important to provide comprehensive information during your consultation.  INSTRUCTIONS: You will be referred to gastroenterology for evaluation, including a colonoscopy and balloon expulsion test. Please increase your dietary fiber and fluid intake to reduce straining during bowel movements. Consider using a footstool or squatty potty to elevate your knees during defecation. Track your symptoms and bowel habits to provide comprehensive information during your gastroenterology consultation. We will draft a letter of support to advocate for insurance coverage of the colonoscopy.  Your Providers PCP: Cone Bernardino MATSU, MD,  [phone]) Referring Provider: Cone Bernardino MATSU, MD,  [phone]) Care Team Provider: Orlie Madelin RAMAN, NP,  [phone])  NEXT STEPS: [x]  Early Intervention: Schedule sooner appointment, call our on-call services, or go to emergency room if there is any significant Increase in pain or discomfort New or worsening symptoms Sudden or severe changes in your health [x]  Flexible Follow-Up: We recommend a No follow-ups on file. for optimal routine care. This allows for progress monitoring and treatment adjustments. [x]  Preventive Care: Schedule your annual preventive care visit! It's typically covered by insurance and helps identify potential health issues early. [x]  Lab & X-ray Appointments: Incomplete tests scheduled today, or call to schedule. X-rays: [facility] (M-F, 8:30am-noon or 1pm-5pm). [x]  Medical Information Release: Sign a release form at front desk to obtain relevant medical information we don't have.  MAKING THE MOST OF OUR FOCUSED 20 MINUTE APPOINTMENTS: [x]   Clearly state your top concerns at the beginning of the visit to focus our discussion [x]   If you anticipate you will need more time, please inform the front desk during  scheduling - we can book multiple appointments in the same week. [x]   If you have transportation problems- use our convenient video appointments or ask about transportation support. [x]   We can get down to business faster if you use MyChart to update information before the visit and submit non-urgent questions before your visit. Thank you for taking the time to provide details through MyChart.  Let our nurse know and she can import this information into your encounter documents.  Arrival and Wait Times: [x]   Arriving on time ensures that everyone receives prompt attention. [x]   Early morning (8a) and afternoon (1p) appointments tend to have shortest wait times. [x]   Unfortunately, we cannot delay appointments for late arrivals or hold slots during phone calls.  Getting Answers and Following Up [x]   Simple Questions & Concerns: For quick questions or basic follow-up after your visit, reach us  at [phone] or MyChart messaging. [x]   Complex Concerns: If your concern is more complex, scheduling an appointment might be best. Discuss this with the staff to find the most suitable option. [x]   Lab & Imaging Results: We'll contact you directly if results are abnormal or you don't use MyChart. Most normal results will be on MyChart within 2-3 business days, with a review message from Dr. Jesus. Haven't heard back in 2 weeks? Need results sooner? Contact us  at [phone]. [x]   Referrals: Our referral coordinator will manage specialist referrals. The specialist's office should contact you within 2 weeks to schedule an appointment. Call us  if you haven't heard from them after 2 weeks.  Staying Connected [x]   MyChart: Activate your MyChart for the fastest way to access results and message us . See the last page of this paperwork for instructions on how to activate.  Bring to Your Next Appointment [x]   Medications: Please bring all your medication bottles to your next appointment to ensure we have an  accurate record of your prescriptions. [x]   Health Diaries: If you're monitoring any health conditions at home, keeping a diary of your readings can be very helpful for discussions at your next appointment.  Billing [x]   X-ray & Lab Orders: These are billed by separate companies. Contact the invoicing company directly for questions or concerns. [x]   Visit Charges: Discuss any billing inquiries with our administrative services team.  Your Satisfaction Matters [x]   Share Your Experience: We strive for your satisfaction! If you have any complaints, or preferably compliments, please let Dr. Jesus know directly or contact our Practice Administrators, Manuelita Rubin or [employer], by asking at the front desk.   Reviewing Your Records [x]   Review this early draft of your clinical encounter notes below and the final encounter summary tomorrow on MyChart after its been completed.  All orders placed so far are visible here: Inflamed internal hemorrhoid -     Ambulatory referral to Gastroenterology  Difficulty defecating          To provide sufficient justification for screening colonoscopy in this 38 year old African heritage male with recurrent symptomatic hemorrhoidal disease and significant straining, the most critical information includes: documentation of persistent or unexplained rectal bleeding, lack of complete symptom resolution with standard hemorrhoid treatment, and any alarm features such as abdominal pain, iron deficiency anemia, or changes in bowel habits. The NCCN guidelines recommend prompt colonoscopy for any patient, regardless of age, who presents with these symptoms, as they are associated with increased risk of early-onset colorectal cancer and diagnostic delays if attributed solely to hemorrhoids.[1]  His African heritage further supports a lower threshold for colonoscopy, as this population has higher CRC incidence, earlier  onset, and worse outcomes. Recent guidelines from  the NCCN and the U.S. Multi-Society Task Force recommend considering earlier screening and more aggressive evaluation in African American patients, especially in the presence of symptoms--even before age 53.[1][2][3]  Recurrent symptomatic hemorrhoidal disease with significant straining and multiple flares increases the risk of missed CRC diagnosis, since hemorrhoidal symptoms can mask underlying neoplasia. Colonoscopy is justified if anoscopy is normal or if symptoms persist despite treatment, as recommended by the American Society of Colon and Rectal Surgeons.[4][5]  Additional information that would further strengthen the case for colonoscopy includes: family history of CRC or polyps, presence of iron deficiency anemia, unexplained weight loss, or changes in bowel habits. These features are associated with higher CRC risk and would prompt even more urgent evaluation.[1][5]  In summary, persistent or recurrent anorectal symptoms, African heritage, and the possibility of missed CRC diagnosis together provide strong justification for screening colonoscopy in this patient, even in the absence of classic alarm features.   Would you like me to review the latest evidence on the prevalence and outcomes of early-onset colorectal cancer in patients presenting with recurrent hemorrhoidal symptoms, particularly in African heritage populations, to further clarify the risk-benefit profile of early colonoscopy in this scenario? References Colorectal Cancer Screening. Occupational hygienist. Updated 2023-10-22. Colorectal Cancer Screening: Recommendations for Physicians and Patients From the U.S. Multi-Society Task Force on Colorectal Cancer. Rex DK, Boland CR, Dominitz JA, et al. Gastrointestinal Endoscopy. 2017;86(1):18-33. doi:10.1016/j.gie.2017.04.003. The American Society of Colon and Rectal Surgeons Clinical Practice Guidelines for the Management of Hemorrhoids. Hawkins AT, Davis BR, Bhama AR,  et al. Diseases of the Colon and Rectum. 2024;67(5):614-623. doi:10.1097/DCR.0000000000003276. Commencing Colorectal Cancer Screening at Age 67 Years in [place]. Racial Groups. Carethers JM. Frontiers in Oncology. 7977;87:033001. doi:10.3389/fonc.2022.966998. Hemorrhoidal Disease. Merrilyn [facility]. JAMA. 7974;:7162224. doi:10.1001/jama.7974.86916.  Practice Guideline  The American Gastroenterological Association recommends several supportive measures for patients with recurrent hemorrhoidal disease and significant straining while awaiting gastroenterology evaluation. The cornerstone of management is a gradual increase in dietary fiber intake (20-30 g/day) and adequate hydration, which can soften stools, reduce straining, and decrease hemorrhoidal symptoms.[1] Fiber can be increased through diet or supplements such as psyllium, which has shown benefit in reducing hemorrhoidal bleeding and painful defecation.[2][3]  For persistent straining or constipation, the AGA suggests adding an inexpensive osmotic agent (e.g., polyethylene glycol or milk of magnesia) and, if needed, a stimulant laxative such as bisacodyl or glycerin suppositories, preferably administered after meals to leverage the gastrocolonic response.[3] These agents are low-cost and generally well tolerated.  Topical analgesics may provide symptomatic relief for local pain and itching, and short-term use of low-potency corticosteroid creams can help with perianal inflammation, but prolonged use of potent corticosteroids should be avoided due to risk of skin damage.[2][1] Over-the-counter topical agents are commonly used, though robust data supporting their efficacy are limited.  If there is suspicion of a defecatory disorder (e.g., dyssynergic defecation), biofeedback therapy may be considered, as it improves symptoms in most patients with outlet dysfunction.[3]  Patients should be counseled to avoid prolonged sitting on the toilet and to respond  promptly to the urge to defecate, as these behaviors can reduce straining and recurrence.[2][1]   In addition to these foundational measures, several adjunctive strategies can further support symptom control and reduce recurrence risk. Clinical trials and meta-analyses consistently demonstrate that fiber supplementation (7-20 g/day) significantly reduces rectal bleeding and persistent symptoms in patients with hemorrhoidal disease, with relative risk reductions of up to 50% compared to placebo.[4][5][6][7] Both soluble and  insoluble fiber sources are effective, and combining them may improve tolerability and bowel motility.[8] Adequate hydration--at least 5-8 glasses of water daily--is essential to optimize fiber's stool-softening effects.[4][9]  Behavioral modifications are equally important. Limiting time on the toilet, avoiding straining, and using a footstool to elevate the knees can facilitate easier defecation and reduce pressure on hemorrhoidal tissue.[9] Responding promptly to the urge to defecate and establishing regular toileting habits (ideally once daily, with minimal straining) are associated with improved outcomes.[4][5][9]  For symptomatic relief, short-term use of topical agents such as hydrocortisone, pramoxine, phenylephrine, or witch hazel may be considered, as these can reduce pain, pruritus, and swelling, though long-term efficacy data are limited and prolonged use of steroids should be avoided.[4][5][7] Sitz baths (10-20 minutes in warm water) are commonly recommended for comfort, despite limited evidence for pain reduction.[4][9] Flavonoid-based phlebotonics (e.g., diosmin, hesperidin) may also provide benefit for bleeding and pruritus, though their use is supported by moderate-quality evidence and should be considered on a case-by-case basis.[5][6]  If constipation persists despite dietary measures, osmotic laxatives (polyethylene glycol, docusate) can be used safely to maintain soft  stools and regular bowel movements.[9][8] For patients with suspected outlet dysfunction, biofeedback therapy is a low-risk intervention that can improve defecatory coordination and reduce straining.[9][10]  Finally, ongoing assessment for alarm features--such as persistent rectal bleeding, iron deficiency anemia, or changes in bowel habits--is critical, as these may warrant expedited evaluation for colorectal cancer or other pathology.[11][4][5]   Would you like me to summarize the evidence and practical guidance on the use of biofeedback therapy for suspected defecatory disorders in patients with recurrent hemorrhoidal disease and straining, including indications, efficacy, and referral pathways? References American Gastroenterological Association Medical Position Statement: Diagnosis and Treatment of Hemorrhoids. Gastroenterology. 2004;126(5):1461-2. doi:10.1053/j.gastro.2004.03.001. American Gastroenterological Association Technical Review on the Diagnosis and Treatment of Hemorrhoids. Madoff RD, Fleshman JW. Gastroenterology. 2004;126(5):1463-73. doi:10.1053/j.gastro.2004.03.008. American Gastroenterological Association Medical Position Statement on Constipation. Marene AE, Dorn SD, Lembo A, Pressman A. Gastroenterology. 2013;144(1):211-7. doi:10.1053/j.gastro.2012.10.029. Hemorrhoidal Disease. Merrilyn [facility]. JAMA. 7974;:7162224. doi:10.1001/jama.7974.86916. The American Society of Colon and Rectal Surgeons Clinical Practice Guidelines for the Management of Hemorrhoids. Hawkins AT, Davis BR, Bhama AR, et al. Diseases of the Colon and Rectum. 2024;67(5):614-623. doi:10.1097/DCR.0000000000003276. Anorectal Emergencies: WSES-AAST Guidelines. Tarasconi A, Perrone G, Davies J, et al. World Journal of Emergency Surgery : WJES. 2021;16(1):48. doi:10.1186/s13017-021-00384-x. Hemorrhoids. Teressa BIRCH. The [place] Journal of Medicine. 2014;371(10):944-51. doi:10.1056/NEJMcp1204188. The American Society of  Colon and Rectal Surgeons Clinical Practice Guidelines for the Evaluation and Management of Chronic Constipation. Alavi K, Thorsen AJ, Fang SH, et al. Diseases of the Colon and Rectum. 2024;67(10):1244-1257. doi:10.1097/DCR.0000000000003430. ACG Clinical Guidelines: Management of Benign Anorectal Disorders. Chriss DELENA Marene AE, Vella NOVAK, et al. The American Journal of Gastroenterology. 2021;116(10):1987-2008. doi:10.14309/ajg.0000000000001507. Benign Anorectal Conditions: Evaluation and Management. Cohee MW, Waddell DELENA, [place] JD. American Family Physician. 2020;101(1):24-33. Colorectal Cancer Screening. Occupational hygienist. Updated 2023-10-22.

## 2024-02-03 NOTE — Assessment & Plan Note (Signed)
 There is concern for colorectal cancer due to recurrent hemorrhoids and African heritage, despite no family history. While there is no direct correlation between hemorrhoids and colorectal cancer, shared risk factors like chronic constipation are acknowledged. Early hemorrhoids and straining suggest defecatroy disorder. Insurance typically covers screening starting at age 38, but efforts will be made to secure earlier screening due to his concerns and risk factors. A letter of support referencing medical literature will be drafted to advocate for insurance coverage of a colonoscopy. He will be referred to gastroenterology for a colonoscopy and further evaluation. Documentation and guidance on discussing symptoms with the gastroenterologist will be provided to support insurance coverage for the colonoscopy. He is encouraged to track symptoms and bowel habits to provide comprehensive information during the gastroenterology consultation.  Also we will have balloon expulsion testing considered.

## 2024-02-03 NOTE — Progress Notes (Signed)
 ==============================   [facility]: [phone]   -- Medical Office Visit --  Patient: Joshua Perkins      Age: 38 y.o.       Sex:  male  Date:   02/03/2024 Today's Healthcare Provider: Bernardino KANDICE Cone, MD  ==============================   Chief Complaint: Hemorrhoids  Discussed the use of AI scribe software for clinical note transcription with the patient, who gave verbal consent to proceed.  History of Present Illness 38 year old male who presents with concerns about hemorrhoids and potential colon cancer risk.  He has experienced hemorrhoids since high school, with approximately five occurrences in total. The most recent episode began about two to three weeks ago, characterized by swelling and discomfort. The swelling has decreased significantly after using hemorrhoid cream, and there is no current pain, although he can still feel a small residual swelling when wiping.  He attributes the recent flare-up to straining during bowel movements. He has a daily bowel movement but experiences straining, which he believes contributes to his hemorrhoid issues. He is not currently experiencing any rectal bleeding, abdominal pain, or changes in bowel habits aside from the straining.  He expresses concern about the potential link between hemorrhoids and colon cancer, particularly given his age and the absence of a family history of colon cancer.   Background Reviewed: Problem List: has Snoring; Circadian rhythm sleep disorder; Mixed hyperlipidemia; Concentration deficit; Prediabetes; OSA (obstructive sleep apnea); RLS (restless legs syndrome); Difficulty defecating; and Inflamed internal hemorrhoid on their problem list. Past Medical History:  has no past medical history on file. Past Surgical History:   has no past surgical history on file. Social History:   reports that he has never smoked. He has never used smokeless tobacco. He reports current  alcohol use. He reports that he does not use drugs. Family History:  family history includes Diabetes in his father; Hypertension in his mother. Allergies:  has no known allergies.   Medication Reconciliation: Current Outpatient Medications on File Prior to Visit  Medication Sig   amphetamine-dextroamphetamine (ADDERALL XR) 20 MG 24 hr capsule Take 20 mg by mouth every morning.   amphetamine-dextroamphetamine (ADDERALL XR) 30 MG 24 hr capsule Take 30 mg by mouth daily.   aspirin  EC 81 MG tablet Take 1 tablet (81 mg total) by mouth daily. Swallow whole.   buPROPion  (WELLBUTRIN  XL) 150 MG 24 hr tablet Take 1 tablet (150 mg total) by mouth daily.   rosuvastatin  (CRESTOR ) 40 MG tablet Take 1 tablet (40 mg total) by mouth daily.   No current facility-administered medications on file prior to visit.   Medications Discontinued During This Encounter  Medication Reason   amphetamine-dextroamphetamine (ADDERALL XR) 15 MG 24 hr capsule      Physical Exam:    02/03/2024    1:09 PM 04/09/2023    9:00 AM 12/24/2022    9:53 AM  Vitals with BMI  Height 6' 0 6' 0 6' 0  Weight 261 lbs 254 lbs 10 oz 247 lbs  BMI 35.39 34.52 33.49  Systolic 138 124 869  Diastolic 88 85 86  Pulse 66 64 64  Vital signs reviewed.  Nursing notes reviewed. Weight trend reviewed. Physical Activity: Not on file   General Appearance:  No acute distress appreciable.   Well-groomed, healthy-appearing male.  Well proportioned with no abnormal fat distribution.  Good muscle tone. Pulmonary:  Normal work of breathing at rest, no respiratory distress apparent. SpO2: 98 %  Musculoskeletal: All extremities  are intact.  Neurological:  Awake, alert, oriented, and engaged.  No obvious focal neurological deficits or cognitive impairments.  Sensorium seems unclouded.   Speech is clear and coherent with logical content. Psychiatric:  Appropriate mood, pleasant and cooperative demeanor, thoughtful and engaged during the exam      04/09/2023    9:07 AM 02/12/2022    3:32 PM 11/29/2021    1:32 PM 03/18/2020    8:37 AM  PHQ 2/9 Scores  PHQ - 2 Score 0 0 0 0  PHQ- 9 Score   0     Office Visit on 04/09/2023  Component Date Value Ref Range Status   WBC 04/09/2023 4.5  4.0 - 10.5 K/uL Final   RBC 04/09/2023 4.72  4.22 - 5.81 Mil/uL Final   Hemoglobin 04/09/2023 14.2  13.0 - 17.0 g/dL Final   HCT 87/89/7975 42.3  39.0 - 52.0 % Final   MCV 04/09/2023 89.6  78.0 - 100.0 fl Final   MCHC 04/09/2023 33.6  30.0 - 36.0 g/dL Final   RDW 87/89/7975 12.8  11.5 - 15.5 % Final   Platelets 04/09/2023 155.0  150.0 - 400.0 K/uL Final   Neutrophils Relative % 04/09/2023 51.1  43.0 - 77.0 % Final   Lymphocytes Relative 04/09/2023 38.2  12.0 - 46.0 % Final   Monocytes Relative 04/09/2023 8.9  3.0 - 12.0 % Final   Eosinophils Relative 04/09/2023 1.4  0.0 - 5.0 % Final   Basophils Relative 04/09/2023 0.4  0.0 - 3.0 % Final   Neutro Abs 04/09/2023 2.3  1.4 - 7.7 K/uL Final   Lymphs Abs 04/09/2023 1.7  0.7 - 4.0 K/uL Final   Monocytes Absolute 04/09/2023 0.4  0.1 - 1.0 K/uL Final   Eosinophils Absolute 04/09/2023 0.1  0.0 - 0.7 K/uL Final   Basophils Absolute 04/09/2023 0.0  0.0 - 0.1 K/uL Final   Sodium 04/09/2023 138  135 - 145 mEq/L Final   Potassium 04/09/2023 4.1  3.5 - 5.1 mEq/L Final   Chloride 04/09/2023 104  96 - 112 mEq/L Final   CO2 04/09/2023 28  19 - 32 mEq/L Final   Glucose, Bld 04/09/2023 75  70 - 99 mg/dL Final   BUN 87/89/7975 17  6 - 23 mg/dL Final   Creatinine, Ser 04/09/2023 1.31  0.40 - 1.50 mg/dL Final   Total Bilirubin 04/09/2023 0.5  0.2 - 1.2 mg/dL Final   Alkaline Phosphatase 04/09/2023 39  39 - 117 U/L Final   AST 04/09/2023 29  0 - 37 U/L Final   ALT 04/09/2023 32  0 - 53 U/L Final   Total Protein 04/09/2023 7.3  6.0 - 8.3 g/dL Final   Albumin 87/89/7975 4.3  3.5 - 5.2 g/dL Final   GFR 87/89/7975 69.49  >60.00 mL/min Final   Calcium  04/09/2023 9.2  8.4 - 10.5 mg/dL Final   Cholesterol 87/89/7975 222  (H)  0 - 200 mg/dL Final   Triglycerides 87/89/7975 70.0  0.0 - 149.0 mg/dL Final   HDL 87/89/7975 53.40  >39.00 mg/dL Final   VLDL 87/89/7975 14.0  0.0 - 40.0 mg/dL Final   LDL Cholesterol 04/09/2023 154 (H)  0 - 99 mg/dL Final   Total CHOL/HDL Ratio 04/09/2023 4   Final   NonHDL 04/09/2023 168.12   Final   Hgb A1c MFr Bld 04/09/2023 5.8  4.6 - 6.5 % Final  No image results found. No results found.       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 Suspected defecatory disorder is likely due to straining during bowel movements, possibly related to chronic constipation. There is no current pain or significant swelling. Chronic constipation may contribute to both hemorrhoids and an increased risk of colorectal cancer. He will be referred to gastroenterology for evaluation, including a colonoscopy and balloon expulsion test. Increasing dietary fiber and fluid intake is advised to reduce straining. The use of a footstool or squatty potty to elevate knees during defecation is recommended. Potential treatments for defecatory disorder, such as rectal dilation, will be discussed. Guidance on using witch hazel and other supportive measures for hemorrhoid management will be provided. Difficulty defecating There is concern for colorectal cancer due to recurrent hemorrhoids and African heritage, despite no family history. While there is no direct correlation between hemorrhoids and colorectal cancer, shared risk factors like chronic constipation are acknowledged. Early hemorrhoids and straining suggest defecatroy disorder. Insurance typically covers screening starting at age 62, but efforts will be made to secure earlier screening due to his concerns and risk factors. A letter of support referencing medical  literature will be drafted to advocate for insurance coverage of a colonoscopy. He will be referred to gastroenterology for a colonoscopy and further evaluation. Documentation and guidance on discussing symptoms with the gastroenterologist will be provided to support insurance coverage for the colonoscopy. He is encouraged to track symptoms and bowel habits to provide comprehensive information during the gastroenterology consultation.  Also we will have balloon expulsion testing considered.   ORDER ASSOCIATIONS  #   DIAGNOSIS / CONDITION ICD-10 ENCOUNTER ORDER     ICD-10-CM   1. Inflamed internal hemorrhoid  K64.8 Ambulatory referral to Gastroenterology    2. Difficulty defecating  K59.00            Orders Placed in Encounter:   Referral Orders         Ambulatory referral to Gastroenterology     ED Discharge Orders          Ordered    Ambulatory referral to Gastroenterology       Comments: ### GI Referral: Hemorrhoids & CRC Screening    38 year old male with a history of recurrent symptomatic hemorrhoidal disease since age 53, presenting with multiple flares characterized by rectal pain, swelling, and discomfort, most recently lasting two weeks and resolving with OTC topical therapy. He reports significant straining with daily bowel movements, but no current rectal pain or swelling. No known family history of colorectal cancer or polyps. No prior colonoscopy.      Given the patient's age, African heritage, and recurrent anorectal symptoms with chronic straining, there is increased concern for underlying colorectal pathology. The [employer] (NCCN) and the [employer] of Colon and Rectal Surgeons recommend that all patients with persistent or unexplained rectal bleeding, especially when not fully explained by hemorrhoids or when symptoms persist after treatment, undergo prompt evaluation including colonoscopy to rule out colorectal cancer and other GI  causes.[1][2][3][4][5]      Additionally, early-onset CRC is increasingly recognized, with up to 10% of new CRC diagnoses occurring in individuals under 50, often without a family history. Red-flag symptoms such as rectal bleeding and chronic constipation/straining are associated with increased risk and should not be attributed solely to hemorrhoids, particularly in younger adults.[1][5]      For hemorrhoidal disease, guideline-based management includes:      -  Thorough history and physical exam, including anoscopy if indicated, to assess for internal/external hemorrhoids and exclude other anorectal pathology.[2][3]      - Conservative management with increased dietary fiber (20-30 g/day), adequate hydration, and minimizing straining, which can significantly reduce symptoms and the need for further intervention.[6][2]      - Office-based procedures (e.g., rubber band ligation, sclerotherapy, infrared coagulation) for persistent or recurrent symptoms not controlled with conservative measures.[6]      - Colonoscopy is indicated if anoscopy is normal or if symptoms persist despite treatment, to exclude other causes of bleeding and to screen for neoplasia, as patients with hemorrhoids have a higher prevalence of adenomas.[2][4]      Requesting gastroenterology evaluation for:      - Colonoscopy for colorectal cancer screening and evaluation of persistent anorectal symptoms, in accordance with NCCN and USPSTF guidelines.      - Assessment and management of recurrent hemorrhoidal disease, including consideration of office-based interventions if indicated.      - Counseling on risk factor modification (fiber, hydration, toileting habits) to reduce recurrence.      Thank you for your attention to this referral.      ### References  1. Colorectal Cancer Screening. Occupational hygienist. Updated 2023-10-22. 2. Hemorrhoidal Disease. [facility]. JAMA. 7974;:[id number].  doi:10.1001/jama.7974.86916. 3. The American Society of Colon and Rectal Surgeons Clinical Practice Guidelines for the Management of Hemorrhoids. Hawkins AT, Davis BR, Bhama AR, et al. Diseases of the Colon and Rectum. 2024;67(5):614-623. doi:10.1097/DCR.0000000000003276. 4. Hemorrhoids. Teressa BIRCH. The [place] Journal of Medicine. 2014;371(10):944-51. doi:10.1056/NEJMcp1204188. 5. Screening for Colorectal Cancer: [place]  Licensed conveyancer. Kristene CULVER, Wadie MJ, Mangione CM, et al. JAMA. 2021;325(19):1965-1977. doi:10.1001/jama.7978.3761. 6. ACG Clinical Guidelines: Management of Benign Anorectal Disorders. Chriss DELENA Furrow AE, Vella NOVAK, et al. The American Journal of Gastroenterology. 2021;116(10):1987-2008. doi:10.14309/ajg.0000000000001507.   02/03/24 1336              This document was synthesized by artificial intelligence ([employer]) using HIPAA-compliant recording of the clinical interaction;   We discussed the use of AI scribe software for clinical note transcription with the patient, who gave verbal consent to proceed. additional Info: This encounter employed state-of-the-art, real-time, collaborative documentation. The patient actively reviewed and assisted in updating their electronic medical record on a shared screen, ensuring transparency and facilitating joint problem-solving for the problem list, overview, and plan. This approach promotes accurate, informed care. The treatment plan was discussed and reviewed in detail, including medication safety, potential side effects, and all patient questions. We confirmed understanding and comfort with the plan. Follow-up instructions were established, including contacting the office for any concerns, returning if symptoms worsen, persist, or new symptoms develop, and precautions for potential emergency department visits.

## 2024-02-03 NOTE — Assessment & Plan Note (Signed)
 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 Suspected defecatory disorder is likely due to straining during bowel movements, possibly related to chronic constipation. There is no current pain or significant swelling. Chronic constipation may contribute to both hemorrhoids and an increased risk of colorectal cancer. He will be referred to gastroenterology for evaluation, including a colonoscopy and balloon expulsion test. Increasing dietary fiber and fluid intake is advised to reduce straining. The use of a footstool or squatty potty to elevate knees during defecation is recommended. Potential treatments for defecatory disorder, such as rectal dilation, will be discussed. Guidance on using witch hazel and other supportive measures for hemorrhoid management will be provided.
